# Patient Record
Sex: Female | Born: 1955 | Race: Asian | Hispanic: No | Marital: Married | Smoking: Never smoker
Health system: Southern US, Community
[De-identification: ages and names within clinical notes are randomized; demographics above are authoritative.]

## PROBLEM LIST (undated history)

## (undated) DIAGNOSIS — T7840XA Allergy, unspecified, initial encounter: Secondary | ICD-10-CM

## (undated) DIAGNOSIS — A159 Respiratory tuberculosis unspecified: Secondary | ICD-10-CM

## (undated) DIAGNOSIS — K8020 Calculus of gallbladder without cholecystitis without obstruction: Secondary | ICD-10-CM

## (undated) DIAGNOSIS — R600 Localized edema: Secondary | ICD-10-CM

## (undated) DIAGNOSIS — R030 Elevated blood-pressure reading, without diagnosis of hypertension: Secondary | ICD-10-CM

## (undated) DIAGNOSIS — E039 Hypothyroidism, unspecified: Secondary | ICD-10-CM

## (undated) DIAGNOSIS — M6740 Ganglion, unspecified site: Secondary | ICD-10-CM

## (undated) DIAGNOSIS — A048 Other specified bacterial intestinal infections: Secondary | ICD-10-CM

## (undated) DIAGNOSIS — E785 Hyperlipidemia, unspecified: Secondary | ICD-10-CM

## (undated) DIAGNOSIS — Z0000 Encounter for general adult medical examination without abnormal findings: Secondary | ICD-10-CM

## (undated) DIAGNOSIS — E079 Disorder of thyroid, unspecified: Secondary | ICD-10-CM

## (undated) DIAGNOSIS — I10 Essential (primary) hypertension: Secondary | ICD-10-CM

## (undated) HISTORY — DX: Encounter for general adult medical examination without abnormal findings: Z00.00

## (undated) HISTORY — DX: Other specified bacterial intestinal infections: A04.8

## (undated) HISTORY — DX: Hyperlipidemia, unspecified: E78.5

## (undated) HISTORY — DX: Localized edema: R60.0

## (undated) HISTORY — DX: Elevated blood-pressure reading, without diagnosis of hypertension: R03.0

## (undated) HISTORY — DX: Disorder of thyroid, unspecified: E07.9

## (undated) HISTORY — DX: Ganglion, unspecified site: M67.40

## (undated) HISTORY — DX: Essential (primary) hypertension: I10

## (undated) HISTORY — DX: Allergy, unspecified, initial encounter: T78.40XA

---

## 2004-02-21 ENCOUNTER — Other Ambulatory Visit: Admission: RE | Admit: 2004-02-21 | Discharge: 2004-02-21 | Payer: Self-pay | Admitting: Family Medicine

## 2004-03-01 ENCOUNTER — Encounter: Admission: RE | Admit: 2004-03-01 | Discharge: 2004-03-01 | Payer: Self-pay | Admitting: Family Medicine

## 2005-11-07 ENCOUNTER — Encounter: Admission: RE | Admit: 2005-11-07 | Discharge: 2005-11-07 | Payer: Self-pay | Admitting: Family Medicine

## 2005-11-13 ENCOUNTER — Other Ambulatory Visit: Admission: RE | Admit: 2005-11-13 | Discharge: 2005-11-13 | Payer: Self-pay | Admitting: Family Medicine

## 2007-10-15 ENCOUNTER — Other Ambulatory Visit: Admission: RE | Admit: 2007-10-15 | Discharge: 2007-10-15 | Payer: Self-pay | Admitting: Family Medicine

## 2009-02-15 ENCOUNTER — Encounter: Admission: RE | Admit: 2009-02-15 | Discharge: 2009-02-15 | Payer: Self-pay | Admitting: Family Medicine

## 2009-03-02 ENCOUNTER — Encounter: Admission: RE | Admit: 2009-03-02 | Discharge: 2009-03-02 | Payer: Self-pay | Admitting: Family Medicine

## 2012-09-21 ENCOUNTER — Encounter: Payer: Self-pay | Admitting: Family Medicine

## 2012-09-21 ENCOUNTER — Ambulatory Visit (INDEPENDENT_AMBULATORY_CARE_PROVIDER_SITE_OTHER): Payer: BC Managed Care – PPO | Admitting: Family Medicine

## 2012-09-21 VITALS — BP 122/80 | HR 64 | Temp 97.9°F | Ht 67.0 in | Wt 154.0 lb

## 2012-09-21 DIAGNOSIS — IMO0001 Reserved for inherently not codable concepts without codable children: Secondary | ICD-10-CM

## 2012-09-21 DIAGNOSIS — E785 Hyperlipidemia, unspecified: Secondary | ICD-10-CM

## 2012-09-21 DIAGNOSIS — R030 Elevated blood-pressure reading, without diagnosis of hypertension: Secondary | ICD-10-CM

## 2012-09-21 DIAGNOSIS — I10 Essential (primary) hypertension: Secondary | ICD-10-CM | POA: Insufficient documentation

## 2012-09-21 DIAGNOSIS — E079 Disorder of thyroid, unspecified: Secondary | ICD-10-CM

## 2012-09-21 DIAGNOSIS — Z0000 Encounter for general adult medical examination without abnormal findings: Secondary | ICD-10-CM

## 2012-09-21 DIAGNOSIS — M6740 Ganglion, unspecified site: Secondary | ICD-10-CM

## 2012-09-21 HISTORY — DX: Reserved for inherently not codable concepts without codable children: IMO0001

## 2012-09-21 HISTORY — DX: Hyperlipidemia, unspecified: E78.5

## 2012-09-21 HISTORY — DX: Essential (primary) hypertension: I10

## 2012-09-21 HISTORY — DX: Encounter for general adult medical examination without abnormal findings: Z00.00

## 2012-09-21 HISTORY — DX: Ganglion, unspecified site: M67.40

## 2012-09-21 MED ORDER — LEVOTHYROXINE SODIUM 88 MCG PO TABS: 88.0000 ug | ORAL_TABLET | Freq: Every day | ORAL | Status: DC

## 2012-09-21 NOTE — Assessment & Plan Note (Signed)
Agrees to return for fasting labs next week and then return for gyn exam next month. Will request old records.

## 2012-09-21 NOTE — Assessment & Plan Note (Signed)
Patient reports mild HA and dysequilibrium when her bp is higher, she notes some numbers as hi as 140/95. Minimize sodium and caffeine, get 8  Hours of sleep nightly and we will recheck at next visit.

## 2012-09-21 NOTE — Assessment & Plan Note (Signed)
Encouraged vigorous massage with  Aspercreme bid and report if enlarges or becomes uncomfortable

## 2012-09-21 NOTE — Progress Notes (Signed)
Patient ID: Jessica Duke, female   DOB: 1955/11/29, 57 y.o.   MRN: [id number] Jessica Duke [id number] Jan 08, 1956 09/21/2012      Progress Note-Follow Up  Subjective  Chief Complaint  Chief Complaint  Patient presents with  . Establish Care    new patient    HPI  Patient is a 57 yo Asian female in today for new patient appt. She is generally in good health but has some concerns. She notes recently getting roughly 1 HA a month. She describes it as pressure in the top of her head. Denies any pattern as to when it happens and she has no associated symptoms except for a mild sense of disequilibrium. She often checks her bp and is running in the 130 to 140 over 90s range. When she is feeling well her bp is 110s over 70s. No CP/palp/sob/fevers/gi or gu c/o. C/o a nontender nodule on left hand which came up suddenly recently. Had a screening colonoscopy at 50.  Past Medical History  Diagnosis Date  . Thyroid disease   . Preventative health care 09/21/2012  . Ganglion cyst 09/21/2012  . Hyperlipidemia 09/21/2012  . Elevated BP 09/21/2012    Past Surgical History  Procedure Date  . Cesarean section 1986    Family History  Problem Relation Age of Onset  . Hypertension Mother   . Other Mother     kidney problem  . Hypertension Father   . Hyperlipidemia Father   . Hypertension Brother     History   Social History  . Marital Status: Married    Spouse Name: N/A    Number of Children: N/A  . Years of Education: N/A   Occupational History  . Not on file.   Social History Main Topics  . Smoking status: Never Smoker   . Smokeless tobacco: Never Used  . Alcohol Use: Yes     Comment: occasionally  . Drug Use: No  . Sexually Active: Not on file   Other Topics Concern  . Not on file   Social History Narrative  . No narrative on file    Current Outpatient Prescriptions on File Prior to Visit  Medication Sig Dispense Refill  . Calcium Carbonate-Vit D-Min (CALCIUM 1200 PO) Take by  mouth.      . levothyroxine (SYNTHROID, LEVOTHROID) 88 MCG tablet Take 1 tablet (88 mcg total) by mouth daily.  30 tablet  1    No Known Allergies  Review of Systems  Review of Systems  Constitutional: Negative for fever and malaise/fatigue.  HENT: Negative for congestion.   Eyes: Negative for discharge.  Respiratory: Negative for shortness of breath.   Cardiovascular: Negative for chest pain, palpitations and leg swelling.  Gastrointestinal: Negative for nausea, abdominal pain and diarrhea.  Genitourinary: Negative for dysuria.  Musculoskeletal: Negative for falls.  Skin: Negative for rash.  Neurological: Negative for loss of consciousness and headaches.  Endo/Heme/Allergies: Negative for polydipsia.  Psychiatric/Behavioral: Negative for depression and suicidal ideas. The patient is not nervous/anxious and does not have insomnia.      Objective  BP 122/80  Pulse 64  Temp 97.9 F (36.6 C) (Oral)  Ht 5\' 7"  (1.702 m)  Wt 154 lb (69.854 kg)  BMI 24.12 kg/m2  SpO2 98%  Physical Exam  Physical Exam  Constitutional: She is oriented to person, place, and time and well-developed, well-nourished, and in no distress. No distress.  HENT:  Head: Normocephalic and atraumatic.  Right Ear: External ear normal.  Left Ear: External ear  normal.  Nose: Nose normal.  Mouth/Throat: Oropharynx is clear and moist. No oropharyngeal exudate.  Eyes: Conjunctivae normal are normal. Pupils are equal, round, and reactive to light. Right eye exhibits no discharge. Left eye exhibits no discharge. No scleral icterus.  Neck: Normal range of motion. Neck supple. No thyromegaly present.  Cardiovascular: Normal rate, regular rhythm, normal heart sounds and intact distal pulses.   No murmur heard. Pulmonary/Chest: Effort normal and breath sounds normal. No respiratory distress. She has no wheezes. She has no rales.  Abdominal: Soft. Bowel sounds are normal. She exhibits no distension and no mass. There  is no tenderness.  Musculoskeletal: Normal range of motion. She exhibits no edema and no tenderness.  Lymphadenopathy:    She has no cervical adenopathy.  Neurological: She is alert and oriented to person, place, and time. She has normal reflexes. No cranial nerve deficit. Coordination normal.  Skin: Skin is warm and dry. No rash noted. She is not diaphoretic.  Psychiatric: Mood, memory and affect normal.     Assessment & Plan  Preventative health care Agrees to return for fasting labs next week and then return for gyn exam next month. Will request old records.  Thyroid disease Given refill on Levothyroxine and check a TSH next week.  Ganglion cyst Encouraged vigorous massage with  Aspercreme bid and report if enlarges or becomes uncomfortable  Hyperlipidemia Avoid trans fats, add Krill oil, check lipid profile  Elevated BP Patient reports mild HA and dysequilibrium when her bp is higher, she notes some numbers as hi as 140/95. Minimize sodium and caffeine, get 8  Hours of sleep nightly and we will recheck at next visit.

## 2012-09-21 NOTE — Assessment & Plan Note (Signed)
Given refill on Levothyroxine and check a TSH next week.

## 2012-09-21 NOTE — Assessment & Plan Note (Signed)
Avoid trans fats, add Krill oil, check lipid profile

## 2012-09-21 NOTE — Patient Instructions (Addendum)
Start a probiotic such as Digestive Advantage by Sch[employer] and MegaRed krill oil caps by [employer]  Rel of rec Dr Sondra Come family  Aspercreme to cyst on hand twice a day Decrease coffee to 1 a day  Preventive Care for Adults, Female A healthy lifestyle and preventive care can promote health and wellness. Preventive health guidelines for women include the following key practices.  A routine yearly physical is a good way to check with your caregiver about your health and preventive screening. It is a chance to share any concerns and updates on your health, and to receive a thorough exam.  Visit your dentist for a routine exam and preventive care every 6 months. Brush your teeth twice a day and floss once a day. Good oral hygiene prevents tooth decay and gum disease.  The frequency of eye exams is based on your age, health, family medical history, use of contact lenses, and other factors. Follow your caregiver's recommendations for frequency of eye exams.  Eat a healthy diet. Foods like vegetables, fruits, whole grains, low-fat dairy products, and lean protein foods contain the nutrients you need without too many calories. Decrease your intake of foods high in solid fats, added sugars, and salt. Eat the right amount of calories for you.Get information about a proper diet from your caregiver, if necessary.  Regular physical exercise is one of the most important things you can do for your health. Most adults should get at least 150 minutes of moderate-intensity exercise (any activity that increases your heart rate and causes you to sweat) each week. In addition, most adults need muscle-strengthening exercises on 2 or more days a week.  Maintain a healthy weight. The body mass index (BMI) is a screening tool to identify possible weight problems. It provides an estimate of body fat based on height and weight. Your caregiver can help determine your BMI, and can help you achieve or maintain a healthy  weight.For adults 20 years and older:  A BMI below 18.5 is considered underweight.  A BMI of 18.5 to 24.9 is normal.  A BMI of 25 to 29.9 is considered overweight.  A BMI of 30 and above is considered obese.  Maintain normal blood lipids and cholesterol levels by exercising and minimizing your intake of saturated fat. Eat a balanced diet with plenty of fruit and vegetables. Blood tests for lipids and cholesterol should begin at age 28 and be repeated every 5 years. If your lipid or cholesterol levels are high, you are over 50, or you are at high risk for heart disease, you may need your cholesterol levels checked more frequently.Ongoing high lipid and cholesterol levels should be treated with medicines if diet and exercise are not effective.  If you smoke, find out from your caregiver how to quit. If you do not use tobacco, do not start.  If you are pregnant, do not drink alcohol. If you are breastfeeding, be very cautious about drinking alcohol. If you are not pregnant and choose to drink alcohol, do not exceed 1 drink per day. One drink is considered to be 12 ounces (355 mL) of beer, 5 ounces (148 mL) of wine, or 1.5 ounces (44 mL) of liquor.  Avoid use of street drugs. Do not share needles with anyone. Ask for help if you need support or instructions about stopping the use of drugs.  High blood pressure causes heart disease and increases the risk of stroke. Your blood pressure should be checked at least every 1 to 2  years. Ongoing high blood pressure should be treated with medicines if weight loss and exercise are not effective.  If you are 8 to 57 years old, ask your caregiver if you should take aspirin to prevent strokes.  Diabetes screening involves taking a blood sample to check your fasting blood sugar level. This should be done once every 3 years, after age 34, if you are within normal weight and without risk factors for diabetes. Testing should be considered at a younger age or be  carried out more frequently if you are overweight and have at least 1 risk factor for diabetes.  Breast cancer screening is essential preventive care for women. You should practice "breast self-awareness." This means understanding the normal appearance and feel of your breasts and may include breast self-examination. Any changes detected, no matter how small, should be reported to a caregiver. Women in their 60s and 30s should have a clinical breast exam (CBE) by a caregiver as part of a regular health exam every 1 to 3 years. After age 69, women should have a CBE every year. Starting at age 53, women should consider having a mammography (breast X-ray test) every year. Women who have a family history of breast cancer should talk to their caregiver about genetic screening. Women at a high risk of breast cancer should talk to their caregivers about having magnetic resonance imaging (MRI) and a mammography every year.  The Pap test is a screening test for cervical cancer. A Pap test can show cell changes on the cervix that might become cervical cancer if left untreated. A Pap test is a procedure in which cells are obtained and examined from the lower end of the uterus (cervix).  Women should have a Pap test starting at age 65.  Between ages 58 and 57, Pap tests should be repeated every 2 years.  Beginning at age 11, you should have a Pap test every 3 years as long as the past 3 Pap tests have been normal.  Some women have medical problems that increase the chance of getting cervical cancer. Talk to your caregiver about these problems. It is especially important to talk to your caregiver if a new problem develops soon after your last Pap test. In these cases, your caregiver may recommend more frequent screening and Pap tests.  The above recommendations are the same for women who have or have not gotten the vaccine for human papillomavirus (HPV).  If you had a hysterectomy for a problem that was not  cancer or a condition that could lead to cancer, then you no longer need Pap tests. Even if you no longer need a Pap test, a regular exam is a good idea to make sure no other problems are starting.  If you are between ages 73 and 72, and you have had normal Pap tests going back 10 years, you no longer need Pap tests. Even if you no longer need a Pap test, a regular exam is a good idea to make sure no other problems are starting.  If you have had past treatment for cervical cancer or a condition that could lead to cancer, you need Pap tests and screening for cancer for at least 20 years after your treatment.  If Pap tests have been discontinued, risk factors (such as a new sexual partner) need to be reassessed to determine if screening should be resumed.  The HPV test is an additional test that may be used for cervical cancer screening. The HPV test looks  for the virus that can cause the cell changes on the cervix. The cells collected during the Pap test can be tested for HPV. The HPV test could be used to screen women aged 46 years and older, and should be used in women of any age who have unclear Pap test results. After the age of 29, women should have HPV testing at the same frequency as a Pap test.  Colorectal cancer can be detected and often prevented. Most routine colorectal cancer screening begins at the age of 43 and continues through age 13. However, your caregiver may recommend screening at an earlier age if you have risk factors for colon cancer. On a yearly basis, your caregiver may provide home test kits to check for hidden blood in the stool. Use of a small camera at the end of a tube, to directly examine the colon (sigmoidoscopy or colonoscopy), can detect the earliest forms of colorectal cancer. Talk to your caregiver about this at age 34, when routine screening begins. Direct examination of the colon should be repeated every 5 to 10 years through age 43, unless early forms of pre-cancerous  polyps or small growths are found.  Hepatitis C blood testing is recommended for all people born from [birth year] through 1965 and any individual with known risks for hepatitis C.  Practice safe sex. Use condoms and avoid high-risk sexual practices to reduce the spread of sexually transmitted infections (STIs). STIs include gonorrhea, chlamydia, syphilis, trichomonas, herpes, HPV, and human immunodeficiency virus (HIV). Herpes, HIV, and HPV are viral illnesses that have no cure. They can result in disability, cancer, and death. Sexually active women aged 32 and younger should be checked for chlamydia. Older women with new or multiple partners should also be tested for chlamydia. Testing for other STIs is recommended if you are sexually active and at increased risk.  Osteoporosis is a disease in which the bones lose minerals and strength with aging. This can result in serious bone fractures. The risk of osteoporosis can be identified using a bone density scan. Women ages 44 and over and women at risk for fractures or osteoporosis should discuss screening with their caregivers. Ask your caregiver whether you should take a calcium supplement or vitamin D to reduce the rate of osteoporosis.  Menopause can be associated with physical symptoms and risks. Hormone replacement therapy is available to decrease symptoms and risks. You should talk to your caregiver about whether hormone replacement therapy is right for you.  Use sunscreen with sun protection factor (SPF) of 30 or more. Apply sunscreen liberally and repeatedly throughout the day. You should seek shade when your shadow is shorter than you. Protect yourself by wearing long sleeves, pants, a wide-brimmed hat, and sunglasses year round, whenever you are outdoors.  Once a month, do a whole body skin exam, using a mirror to look at the skin on your back. Notify your caregiver of new moles, moles that have irregular borders, moles that are larger than a pencil  eraser, or moles that have changed in shape or color.  Stay current with required immunizations.  Influenza. You need a dose every fall (or winter). The composition of the flu vaccine changes each year, so being vaccinated once is not enough.  Pneumococcal polysaccharide. You need 1 to 2 doses if you smoke cigarettes or if you have certain chronic medical conditions. You need 1 dose at age 11 (or older) if you have never been vaccinated.  Tetanus, diphtheria, pertussis (Tdap, Td). Get 1 dose  of Tdap vaccine if you are younger than age 67, are over 16 and have contact with an infant, are a Dietitian, are pregnant, or simply want to be protected from whooping cough. After that, you need a Td booster dose every 10 years. Consult your caregiver if you have not had at least 3 tetanus and diphtheria-containing shots sometime in your life or have a deep or dirty wound.  HPV. You need this vaccine if you are a woman age 53 or younger. The vaccine is given in 3 doses over 6 months.  Measles, mumps, rubella (MMR). You need at least 1 dose of MMR if you were born in 1957 or later. You may also need a second dose.  Meningococcal. If you are age 28 to 57 and a first-year college student living in a residence hall, or have one of several medical conditions, you need to get vaccinated against meningococcal disease. You may also need additional booster doses.  Zoster (shingles). If you are age 45 or older, you should get this vaccine.  Varicella (chickenpox). If you have never had chickenpox or you were vaccinated but received only 1 dose, talk to your caregiver to find out if you need this vaccine.  Hepatitis A. You need this vaccine if you have a specific risk factor for hepatitis A virus infection or you simply wish to be protected from this disease. The vaccine is usually given as 2 doses, 6 to 18 months apart.  Hepatitis B. You need this vaccine if you have a specific risk factor for hepatitis B  virus infection or you simply wish to be protected from this disease. The vaccine is given in 3 doses, usually over 6 months. Preventive Services / Frequency Ages 52 to 14  Blood pressure check.** / Every 1 to 2 years.  Lipid and cholesterol check.** / Every 5 years beginning at age 67.  Clinical breast exam.** / Every 3 years for women in their 53s and 46s.  Pap test.** / Every 2 years from ages 12 through 95. Every 3 years starting at age 73 through age 42 or 38 with a history of 3 consecutive normal Pap tests.  HPV screening.** / Every 3 years from ages 11 through ages 82 to 72 with a history of 3 consecutive normal Pap tests.  Hepatitis C blood test.** / For any individual with known risks for hepatitis C.  Skin self-exam. / Monthly.  Influenza immunization.** / Every year.  Pneumococcal polysaccharide immunization.** / 1 to 2 doses if you smoke cigarettes or if you have certain chronic medical conditions.  Tetanus, diphtheria, pertussis (Tdap, Td) immunization. / A one-time dose of Tdap vaccine. After that, you need a Td booster dose every 10 years.  HPV immunization. / 3 doses over 6 months, if you are 79 and younger.  Measles, mumps, rubella (MMR) immunization. / You need at least 1 dose of MMR if you were born in 1957 or later. You may also need a second dose.  Meningococcal immunization. / 1 dose if you are age 30 to 62 and a first-year college student living in a residence hall, or have one of several medical conditions, you need to get vaccinated against meningococcal disease. You may also need additional booster doses.  Varicella immunization.** / Consult your caregiver.  Hepatitis A immunization.** / Consult your caregiver. 2 doses, 6 to 18 months apart.  Hepatitis B immunization.** / Consult your caregiver. 3 doses usually over 6 months. Ages 66 to 71  Blood  pressure check.** / Every 1 to 2 years.  Lipid and cholesterol check.** / Every 5 years beginning at age  89.  Clinical breast exam.** / Every year after age 76.  Mammogram.** / Every year beginning at age 32 and continuing for as long as you are in good health. Consult with your caregiver.  Pap test.** / Every 3 years starting at age 40 through age 66 or 35 with a history of 3 consecutive normal Pap tests.  HPV screening.** / Every 3 years from ages 88 through ages 66 to 33 with a history of 3 consecutive normal Pap tests.  Fecal occult blood test (FOBT) of stool. / Every year beginning at age 38 and continuing until age 23. You may not need to do this test if you get a colonoscopy every 10 years.  Flexible sigmoidoscopy or colonoscopy.** / Every 5 years for a flexible sigmoidoscopy or every 10 years for a colonoscopy beginning at age 59 and continuing until age 2.  Hepatitis C blood test.** / For all people born from [birth year] through 1965 and any individual with known risks for hepatitis C.  Skin self-exam. / Monthly.  Influenza immunization.** / Every year.  Pneumococcal polysaccharide immunization.** / 1 to 2 doses if you smoke cigarettes or if you have certain chronic medical conditions.  Tetanus, diphtheria, pertussis (Tdap, Td) immunization.** / A one-time dose of Tdap vaccine. After that, you need a Td booster dose every 10 years.  Measles, mumps, rubella (MMR) immunization. / You need at least 1 dose of MMR if you were born in 1957 or later. You may also need a second dose.  Varicella immunization.** / Consult your caregiver.  Meningococcal immunization.** / Consult your caregiver.  Hepatitis A immunization.** / Consult your caregiver. 2 doses, 6 to 18 months apart.  Hepatitis B immunization.** / Consult your caregiver. 3 doses, usually over 6 months. Ages 52 and over  Blood pressure check.** / Every 1 to 2 years.  Lipid and cholesterol check.** / Every 5 years beginning at age 63.  Clinical breast exam.** / Every year after age 22.  Mammogram.** / Every year beginning at  age 3 and continuing for as long as you are in good health. Consult with your caregiver.  Pap test.** / Every 3 years starting at age 108 through age 48 or 80 with a 3 consecutive normal Pap tests. Testing can be stopped between 65 and 70 with 3 consecutive normal Pap tests and no abnormal Pap or HPV tests in the past 10 years.  HPV screening.** / Every 3 years from ages 38 through ages 22 or 44 with a history of 3 consecutive normal Pap tests. Testing can be stopped between 65 and 70 with 3 consecutive normal Pap tests and no abnormal Pap or HPV tests in the past 10 years.  Fecal occult blood test (FOBT) of stool. / Every year beginning at age 33 and continuing until age 46. You may not need to do this test if you get a colonoscopy every 10 years.  Flexible sigmoidoscopy or colonoscopy.** / Every 5 years for a flexible sigmoidoscopy or every 10 years for a colonoscopy beginning at age 41 and continuing until age 84.  Hepatitis C blood test.** / For all people born from [birth year] through 1965 and any individual with known risks for hepatitis C.  Osteoporosis screening.** / A one-time screening for women ages 82 and over and women at risk for fractures or osteoporosis.  Skin self-exam. / Monthly.  Influenza  immunization.** / Every year.  Pneumococcal polysaccharide immunization.** / 1 dose at age 63 (or older) if you have never been vaccinated.  Tetanus, diphtheria, pertussis (Tdap, Td) immunization. / A one-time dose of Tdap vaccine if you are over 65 and have contact with an infant, are a Research scientist (physical sciences), or simply want to be protected from whooping cough. After that, you need a Td booster dose every 10 years.  Varicella immunization.** / Consult your caregiver.  Meningococcal immunization.** / Consult your caregiver.  Hepatitis A immunization.** / Consult your caregiver. 2 doses, 6 to 18 months apart.  Hepatitis B immunization.** / Check with your caregiver. 3 doses, usually over 6  months. ** Family history and personal history of risk and conditions may change your caregiver's recommendations. Document Released: 10/07/2001 Document Revised: 11/03/2011 Document Reviewed: 01/06/2011 [facility] Patient Information 2013 [place], [place].

## 2012-09-27 ENCOUNTER — Telehealth: Payer: Self-pay

## 2012-09-27 ENCOUNTER — Other Ambulatory Visit: Payer: Self-pay | Admitting: Family Medicine

## 2012-09-27 NOTE — Telephone Encounter (Signed)
Opened in error

## 2012-09-28 LAB — BASIC METABOLIC PANEL
CO2: 27 mEq/L (ref 19–32)
Calcium: 9.4 mg/dL (ref 8.4–10.5)
Chloride: 105 mEq/L (ref 96–112)
Creat: 0.77 mg/dL (ref 0.50–1.10)
Sodium: 141 mEq/L (ref 135–145)

## 2012-09-28 LAB — CBC
HCT: 37.9 % (ref 36.0–46.0)
MCH: 30.5 pg (ref 26.0–34.0)
MCV: 89.6 fL (ref 78.0–100.0)
Platelets: 177 10*3/uL (ref 150–400)

## 2012-09-28 LAB — HEPATIC FUNCTION PANEL
ALT: 11 U/L (ref 0–35)
Alkaline Phosphatase: 42 U/L (ref 39–117)
Total Protein: 6.4 g/dL (ref 6.0–8.3)

## 2012-09-28 LAB — TSH: TSH: 0.887 u[IU]/mL (ref 0.350–4.500)

## 2012-09-28 LAB — LIPID PANEL
HDL: 62 mg/dL (ref >39)
Total CHOL/HDL Ratio: 3.6 Ratio
Triglycerides: 98 mg/dL (ref <150)
VLDL: 20 mg/dL (ref 0–40)

## 2012-09-29 MED ORDER — AMOXICILLIN 500 MG PO CAPS: 1000.0000 mg | ORAL_CAPSULE | Freq: Two times a day (BID) | ORAL | Status: DC

## 2012-09-29 MED ORDER — CLARITHROMYCIN 500 MG PO TABS: 500.0000 mg | ORAL_TABLET | Freq: Two times a day (BID) | ORAL | Status: DC

## 2012-09-29 MED ORDER — OMEPRAZOLE 20 MG PO CPDR: 20.0000 mg | DELAYED_RELEASE_CAPSULE | Freq: Two times a day (BID) | ORAL | Status: DC

## 2012-09-29 NOTE — Addendum Note (Signed)
Addended by: Court Joy on: 09/29/2012 12:29 PM   Modules accepted: Orders

## 2012-10-18 ENCOUNTER — Ambulatory Visit (INDEPENDENT_AMBULATORY_CARE_PROVIDER_SITE_OTHER): Payer: BC Managed Care – PPO | Admitting: Family Medicine

## 2012-10-18 ENCOUNTER — Encounter: Payer: Self-pay | Admitting: Family Medicine

## 2012-10-18 VITALS — BP 118/78 | HR 63 | Temp 97.7°F | Ht 67.0 in | Wt 156.0 lb

## 2012-10-18 DIAGNOSIS — R1013 Epigastric pain: Secondary | ICD-10-CM

## 2012-10-18 DIAGNOSIS — A048 Other specified bacterial intestinal infections: Secondary | ICD-10-CM

## 2012-10-18 DIAGNOSIS — R600 Localized edema: Secondary | ICD-10-CM

## 2012-10-18 DIAGNOSIS — R609 Edema, unspecified: Secondary | ICD-10-CM

## 2012-10-18 DIAGNOSIS — E785 Hyperlipidemia, unspecified: Secondary | ICD-10-CM

## 2012-10-18 DIAGNOSIS — T7840XA Allergy, unspecified, initial encounter: Secondary | ICD-10-CM

## 2012-10-18 DIAGNOSIS — K3189 Other diseases of stomach and duodenum: Secondary | ICD-10-CM

## 2012-10-18 DIAGNOSIS — R030 Elevated blood-pressure reading, without diagnosis of hypertension: Secondary | ICD-10-CM

## 2012-10-18 HISTORY — DX: Allergy, unspecified, initial encounter: T78.40XA

## 2012-10-18 HISTORY — DX: Other specified bacterial intestinal infections: A04.8

## 2012-10-18 HISTORY — DX: Localized edema: R60.0

## 2012-10-18 MED ORDER — OMEPRAZOLE 20 MG PO CPDR: 20.0000 mg | DELAYED_RELEASE_CAPSULE | Freq: Every day | ORAL | Status: DC

## 2012-10-18 MED ORDER — PROBIOTIC PRODUCT PO CAPS: ORAL_CAPSULE | ORAL | Status: DC

## 2012-10-18 MED ORDER — CETIRIZINE HCL 10 MG PO TABS: 10.0000 mg | ORAL_TABLET | Freq: Every day | ORAL | Status: DC | PRN

## 2012-10-18 MED ORDER — FLUTICASONE PROPIONATE 50 MCG/ACT NA SUSP: 2.0000 | Freq: Every day | NASAL | Status: DC

## 2012-10-18 NOTE — Assessment & Plan Note (Signed)
Mild, start Krill oil, avoid trans fats

## 2012-10-18 NOTE — Assessment & Plan Note (Signed)
Swollen eyelids each am, consider allergic, given Cetirizine 10 mg daily and Flonase daily

## 2012-10-18 NOTE — Patient Instructions (Addendum)
Helicobacter Pylori and Ulcer Disease  An ulcer may be in your stomach (gastric ulcer) or in the first part of your small bowel, which is called the duodenum (duodenal ulcer). An ulcer is a break in the stomach or duodenum lining. The break wears down into the deeper tissue. Helicobacter pylori (H. pylori) is a type of germ (bacteria) that may cause the majority of gastric or duodenal ulcers.  CAUSES   · A germ (bacterium). H. pylori can weaken the protective mucous coating of the stomach and duodenum. This allows acid to get through to the sensitive lining of the stomach or duodenum and an ulcer can then form.  · Certain medications.  · Using substances that can bother the lining of the stomach (alcohol, tobacco or medications such as Advil or Motrin) in the presence of H.pylori infection. This can increase the chances of getting an ulcer.  · Cancer (rarely).  Most people infected with H. pylori do not get ulcers. It is not known how people catch H. pylori. It may be through food or water. H. pylori has been found in the saliva of some infected people. Therefore, the bacteria may also spread through mouth-to-mouth contact such as kissing.  SYMPTOMS   The problems (symptoms) of ulcer disease are usually:  · A burning or gnawing of the mid-upper belly (abdomen). This is often worse on an empty stomach. It may get better with food. This may be associated with feeling sick to your stomach (nausea), bloating and vomiting.  · If the ulcer results in bleeding, it can cause:  · Black, tarry stools.  · Throwing up bright red blood.  · Throwing up coffee ground looking materials.  With severe bleeding, there may be loss of consciousness and shock.  Besides ulcer disease, H. pylori can also cause chronic gastritis (irritation of the lining of the stomach without ulcer) or stomach acid-type discomfort. You may not have symptoms even though you have an H. pylori infection. Although this is an infection, you may not have usual  infection symptoms (such as fever).  DIAGNOSIS   Ulcer disease can be diagnosed in many different ways. If you have an ulcer, it is important to know whether or not it is caused by H. Pylori. Treatment for an ulcer caused by H. pylori is different from that for an ulcer with other causes. The best way to detect H. pylori is taking tissue directly from the ulcer during an endoscopy test.   · An endoscopy is an exam that uses an endoscope. This is a thin, lighted tube with a small camera on the end. It is like a flexible telescope. The patient is given a drug to make them calm (sedative). The caregiver eases the endoscope into the mouth and down the throat to the stomach and duodenum. This allows the doctor to see the lining of the esophagus, stomach and duodenum.  · If an endoscopy is not needed, then H. pylori can be detected with tests of the blood, stool or even breath.  TREATMENT   · H. pylori peptic ulcer treatment usually involves a combination of:  · Medicines that kill germs (antibiotics).  · Acid suppressors.  · Stomach protectors.  · The use of only one medication to treat H. pylori is not recommended. The most proven treatment is a 2 week course of treatment called triple therapy. It involves taking two antibiotics to kill the bacteria and either an acid suppressor or stomach-lining shield. Two-week triple therapy reduces ulcer symptoms,   kills the bacteria, and prevents ulcers from coming back in many patients.  · Unfortunately, patients may find triple therapy hard to do. This is because it involves taking as many as 20 pills a day. Also, the antibiotics used in triple therapy may cause mild side effects. These include nausea, vomiting, diarrhea, dark stools, a metallic taste in the mouth, dizziness, headache and yeast infections in women. Talk to your caregiver if you have any of these side effects.  HOME CARE INSTRUCTIONS   · Take your medications as directed and for as long as prescribed. Contact your  caregiver if you have problems or side effects from your medications.  · Continue regular work and usual activities unless told otherwise by your caregiver.  · Avoid tobacco, alcohol and caffeine. Tobacco use will decrease and slow healing.  · Avoid medications that are harmful. This includes aspirin and NSAIDS such as ibuprofen and naproxen.  · Avoid foods that seem to aggravate or cause discomfort.  · There are many over-the-counter products available to control stomach acid and other symptoms. Discuss these with your caregiver before using them. Do not  stop taking prescription medications for over-the-counter medications without talking with your caregiver.  · Special diets are not usually needed.  · Keep any follow-up appointments and blood tests as directed.  SEEK MEDICAL CARE IF:   · Your pain or other ulcer symptoms do not improve within a few days of starting treatment.  · You develop diarrhea. This can be a problem related to certain treatments.  · You have ongoing indigestion or heartburn even if your main ulcer symptoms are improved.  · You think you have any side effects from your medications or if you do not understand how to use your medications right.  SEEK IMMEDIATE MEDICAL CARE IF:   Any of the following happen:  · You develop bright red, rectal bleeding.  · You develop dark black, tarry stools.  · You throw up (vomit) blood.  · You become light-headed, weak, have fainting episodes, or become sweaty, cold and clammy.  · You have severe abdominal pain not controlled by medications. Do not take pain medications unless ordered by your caregiver.  MAKE SURE YOU:   · Understand these instructions.  · Will watch your condition.  · Will get help right away if you are not doing well or get worse.  Document Released: 11/01/2003 Document Revised: 11/03/2011 Document Reviewed: 03/30/2008  [employer]® Patient Information ©2013 [employer].

## 2012-10-18 NOTE — Assessment & Plan Note (Signed)
Mild elevation, will continue to monitor

## 2012-10-18 NOTE — Assessment & Plan Note (Signed)
Trace, encouraged compression hose, minimize sodium, report worsening symptoms

## 2012-10-18 NOTE — Assessment & Plan Note (Signed)
Finished acute treatment, symptoms improving but still belching and having dyspepsia. Start probiotics and Omeprazole

## 2012-10-18 NOTE — Progress Notes (Signed)
Patient ID: Jessica Duke, female   DOB: 1955-08-28, 57 y.o.   MRN: [id number] Jessica Duke [id number] 1956/02/01 10/18/2012      Progress Note-Follow Up  Subjective  Chief Complaint  Chief Complaint  Patient presents with  . Follow-up    4 week     HPI  Patient is a 57 year old Asian female who is in today for followup. She's recently finished her course of treatment for H. pylori infection as she is feeling better. She still has some mild dyspepsia bloating but her pain is greatly improved. No nausea vomiting. No anorexia or fevers. No GU complaints. Overall she feels improved. She's complaining of some edema in her ankles as well as some edema in her eyes at times most notably in the morning. No headache, ear pain, chest pain, palpitations, shortness of breath.  Past Medical History  Diagnosis Date  . Thyroid disease   . Preventative health care 09/21/2012  . Ganglion cyst 09/21/2012  . Hyperlipidemia 09/21/2012  . Elevated BP 09/21/2012  . H. pylori infection 10/18/2012  . Allergic state 10/18/2012  . Pedal edema 10/18/2012    Past Surgical History  Procedure Laterality Date  . Cesarean section  1986    Family History  Problem Relation Age of Onset  . Hypertension Mother   . Other Mother     kidney problem  . Hypertension Father   . Hyperlipidemia Father   . Hypertension Brother     History   Social History  . Marital Status: Married    Spouse Name: N/A    Number of Children: N/A  . Years of Education: N/A   Occupational History  . Not on file.   Social History Main Topics  . Smoking status: Never Smoker   . Smokeless tobacco: Never Used  . Alcohol Use: Yes     Comment: occasionally  . Drug Use: No  . Sexually Active: Not on file   Other Topics Concern  . Not on file   Social History Narrative  . No narrative on file    Current Outpatient Prescriptions on File Prior to Visit  Medication Sig Dispense Refill  . Calcium Carbonate-Vit D-Min (CALCIUM 1200 PO)  Take by mouth.      . fish oil-omega-3 fatty acids 1000 MG capsule Take 2 g by mouth daily.      Marland Kitchen levothyroxine (SYNTHROID, LEVOTHROID) 88 MCG tablet Take 1 tablet (88 mcg total) by mouth daily.  30 tablet  1  . Multiple Vitamin (MULTIVITAMIN) tablet Take 1 tablet by mouth daily.       No current facility-administered medications on file prior to visit.    No Known Allergies  Review of Systems  Review of Systems  Constitutional: Negative for fever and malaise/fatigue.  HENT: Negative for congestion.   Eyes: Negative for discharge.  Respiratory: Negative for shortness of breath.   Cardiovascular: Negative for chest pain, palpitations and leg swelling.  Gastrointestinal: Positive for heartburn. Negative for nausea, vomiting, abdominal pain and diarrhea.  Genitourinary: Negative for dysuria.  Musculoskeletal: Negative for falls.  Skin: Negative for rash.  Neurological: Negative for loss of consciousness and headaches.  Endo/Heme/Allergies: Negative for polydipsia.  Psychiatric/Behavioral: Negative for depression and suicidal ideas. The patient is not nervous/anxious and does not have insomnia.     Objective  BP 118/78  Pulse 63  Temp(Src) 97.7 F (36.5 C) (Oral)  Ht 5\' 7"  (1.702 m)  Wt 156 lb (70.761 kg)  BMI 24.43 kg/m2  SpO2 98%  Physical  Exam  Physical Exam  Constitutional: She is oriented to person, place, and time and well-developed, well-nourished, and in no distress. No distress.  HENT:  Head: Normocephalic and atraumatic.  Eyes: Conjunctivae are normal.  Neck: Neck supple. No thyromegaly present.  Cardiovascular: Normal rate, regular rhythm and normal heart sounds.   No murmur heard. Pulmonary/Chest: Effort normal and breath sounds normal. She has no wheezes.  Abdominal: Soft. Bowel sounds are normal. She exhibits no distension and no mass. There is no tenderness. There is no rebound and no guarding.  Musculoskeletal: She exhibits no edema.  Lymphadenopathy:     She has no cervical adenopathy.  Neurological: She is alert and oriented to person, place, and time.  Skin: Skin is warm and dry. No rash noted. She is not diaphoretic.  Psychiatric: Memory, affect and judgment normal.    Lab Results  Component Value Date   TSH 0.887 09/27/2012   Lab Results  Component Value Date   WBC 4.8 09/27/2012   HGB 12.9 09/27/2012   HCT 37.9 09/27/2012   MCV 89.6 09/27/2012   PLT 177 09/27/2012   Lab Results  Component Value Date   CREATININE 0.77 09/27/2012   BUN 13 09/27/2012   NA 141 09/27/2012   K 4.2 09/27/2012   CL 105 09/27/2012   CO2 27 09/27/2012   Lab Results  Component Value Date   ALT 11 09/27/2012   AST 14 09/27/2012   ALKPHOS 42 09/27/2012   BILITOT 0.7 09/27/2012   Lab Results  Component Value Date   CHOL 226* 09/27/2012   Lab Results  Component Value Date   HDL 62 09/27/2012   Lab Results  Component Value Date   LDLCALC 144* 09/27/2012   Lab Results  Component Value Date   TRIG 98 09/27/2012   Lab Results  Component Value Date   CHOLHDL 3.6 09/27/2012     Assessment & Plan  H. pylori infection Finished acute treatment, symptoms improving but still belching and having dyspepsia. Start probiotics and Omeprazole  Elevated BP Mild elevation, will continue to monitor  Hyperlipidemia Mild, start Krill oil, avoid trans fats  Pedal edema Trace, encouraged compression hose, minimize sodium, report worsening symptoms  Allergic state Swollen eyelids each am, consider allergic, given Cetirizine 10 mg daily and Flonase daily

## 2012-10-20 ENCOUNTER — Encounter: Payer: Self-pay | Admitting: Family Medicine

## 2013-01-20 ENCOUNTER — Ambulatory Visit (INDEPENDENT_AMBULATORY_CARE_PROVIDER_SITE_OTHER): Payer: BC Managed Care – PPO | Admitting: Family Medicine

## 2013-01-20 ENCOUNTER — Encounter: Payer: Self-pay | Admitting: Family Medicine

## 2013-01-20 VITALS — BP 132/88 | HR 57 | Temp 97.5°F | Ht 67.0 in | Wt 150.1 lb

## 2013-01-20 DIAGNOSIS — K3189 Other diseases of stomach and duodenum: Secondary | ICD-10-CM

## 2013-01-20 DIAGNOSIS — E785 Hyperlipidemia, unspecified: Secondary | ICD-10-CM

## 2013-01-20 DIAGNOSIS — R1013 Epigastric pain: Secondary | ICD-10-CM

## 2013-01-20 DIAGNOSIS — R030 Elevated blood-pressure reading, without diagnosis of hypertension: Secondary | ICD-10-CM

## 2013-01-20 DIAGNOSIS — T7840XA Allergy, unspecified, initial encounter: Secondary | ICD-10-CM

## 2013-01-20 DIAGNOSIS — R739 Hyperglycemia, unspecified: Secondary | ICD-10-CM

## 2013-01-20 DIAGNOSIS — R5381 Other malaise: Secondary | ICD-10-CM

## 2013-01-20 DIAGNOSIS — E039 Hypothyroidism, unspecified: Secondary | ICD-10-CM

## 2013-01-20 DIAGNOSIS — I10 Essential (primary) hypertension: Secondary | ICD-10-CM

## 2013-01-20 DIAGNOSIS — R7309 Other abnormal glucose: Secondary | ICD-10-CM

## 2013-01-20 LAB — CBC
MCHC: 34 g/dL (ref 30.0–36.0)
RDW: 13.2 % (ref 11.5–15.5)
WBC: 5.7 10*3/uL (ref 4.0–10.5)

## 2013-01-20 LAB — RENAL FUNCTION PANEL
Albumin: 4.6 g/dL (ref 3.5–5.2)
BUN: 15 mg/dL (ref 6–23)
CO2: 28 mEq/L (ref 19–32)
Creat: 0.84 mg/dL (ref 0.50–1.10)
Potassium: 4.3 mEq/L (ref 3.5–5.3)

## 2013-01-20 LAB — HEMOGLOBIN A1C: Hgb A1c MFr Bld: 5.7 % — ABNORMAL HIGH (ref <5.7)

## 2013-01-20 MED ORDER — LEVOTHYROXINE SODIUM 88 MCG PO TABS: 88.0000 ug | ORAL_TABLET | Freq: Every day | ORAL | Status: DC

## 2013-01-20 MED ORDER — LISINOPRIL 5 MG PO TABS: 5.0000 mg | ORAL_TABLET | Freq: Every day | ORAL | Status: DC

## 2013-01-20 MED ORDER — FLUTICASONE PROPIONATE 50 MCG/ACT NA SUSP: 2.0000 | Freq: Every day | NASAL | Status: DC

## 2013-01-20 MED ORDER — OMEPRAZOLE 20 MG PO CPDR: 20.0000 mg | DELAYED_RELEASE_CAPSULE | Freq: Every day | ORAL | Status: DC

## 2013-01-20 NOTE — Patient Instructions (Addendum)
Try switching from Zyrtec to Allegra for the eyes  Start a probiotic Digestive Advantage  Labs prior to next visit, fasting lipid, renal, cbc, tsh, hepatic, hgba1c   Allergies, Generic Allergies may happen from anything your body is sensitive to. This may be food, medicines, pollens, chemicals, and nearly anything around you in everyday life that produces allergens. An allergen is anything that causes an allergy producing substance. Heredity is often a factor in causing these problems. This means you may have some of the same allergies as your parents. Food allergies happen in all age groups. Food allergies are some of the most severe and life threatening. Some common food allergies are cow's milk, seafood, eggs, nuts, wheat, and soybeans. SYMPTOMS   Swelling around the mouth.  An itchy red rash or hives.  Vomiting or diarrhea.  Difficulty breathing. SEVERE ALLERGIC REACTIONS ARE LIFE-THREATENING. This reaction is called anaphylaxis. It can cause the mouth and throat to swell and cause difficulty with breathing and swallowing. In severe reactions only a trace amount of food (for example, peanut oil in a salad) may cause death within seconds. Seasonal allergies occur in all age groups. These are seasonal because they usually occur during the same season every year. They may be a reaction to molds, grass pollens, or tree pollens. Other causes of problems are house dust mite allergens, pet dander, and mold spores. The symptoms often consist of nasal congestion, a runny itchy nose associated with sneezing, and tearing itchy eyes. There is often an associated itching of the mouth and ears. The problems happen when you come in contact with pollens and other allergens. Allergens are the particles in the air that the body reacts to with an allergic reaction. This causes you to release allergic antibodies. Through a chain of events, these eventually cause you to release histamine into the blood stream.  Although it is meant to be protective to the body, it is this release that causes your discomfort. This is why you were given anti-histamines to feel better. If you are unable to pinpoint the offending allergen, it may be determined by skin or blood testing. Allergies cannot be cured but can be controlled with medicine. Hay fever is a collection of all or some of the seasonal allergy problems. It may often be treated with simple over-the-counter medicine such as diphenhydramine. Take medicine as directed. Do not drink alcohol or drive while taking this medicine. Check with your caregiver or package insert for child dosages. If these medicines are not effective, there are many new medicines your caregiver can prescribe. Stronger medicine such as nasal spray, eye drops, and corticosteroids may be used if the first things you try do not work well. Other treatments such as immunotherapy or desensitizing injections can be used if all else fails. Follow up with your caregiver if problems continue. These seasonal allergies are usually not life threatening. They are generally more of a nuisance that can often be handled using medicine. HOME CARE INSTRUCTIONS   If unsure what causes a reaction, keep a diary of foods eaten and symptoms that follow. Avoid foods that cause reactions.  If hives or rash are present:  Take medicine as directed.  You may use an over-the-counter antihistamine (diphenhydramine) for hives and itching as needed.  Apply cold compresses (cloths) to the skin or take baths in cool water. Avoid hot baths or showers. Heat will make a rash and itching worse.  If you are severely allergic:  Following a treatment for a severe  reaction, hospitalization is often required for closer follow-up.  Wear a medic-alert bracelet or necklace stating the allergy.  You and your family must learn how to give adrenaline or use an anaphylaxis kit.  If you have had a severe reaction, always carry your  anaphylaxis kit or EpiPen with you. Use this medicine as directed by your caregiver if a severe reaction is occurring. Failure to do so could have a fatal outcome. SEEK MEDICAL CARE IF:  You suspect a food allergy. Symptoms generally happen within 30 minutes of eating a food.  Your symptoms have not gone away within 2 days or are getting worse.  You develop new symptoms.  You want to retest yourself or your child with a food or drink you think causes an allergic reaction. Never do this if an anaphylactic reaction to that food or drink has happened before. Only do this under the care of a caregiver. SEEK IMMEDIATE MEDICAL CARE IF:   You have difficulty breathing, are wheezing, or have a tight feeling in your chest or throat.  You have a swollen mouth, or you have hives, swelling, or itching all over your body.  You have had a severe reaction that has responded to your anaphylaxis kit or an EpiPen. These reactions may return when the medicine has worn off. These reactions should be considered life threatening. MAKE SURE YOU:   Understand these instructions.  Will watch your condition.  Will get help right away if you are not doing well or get worse. Document Released: 11/04/2002 Document Revised: 11/03/2011 Document Reviewed: 04/10/2008 [facility] Patient Information 2014 [place], [place].   Jessica Duke

## 2013-01-23 ENCOUNTER — Encounter: Payer: Self-pay | Admitting: Family Medicine

## 2013-01-23 NOTE — Assessment & Plan Note (Addendum)
Add Lisinopril due to patient being symptomatic

## 2013-01-23 NOTE — Assessment & Plan Note (Signed)
Avoid trans fats, add a krill oil caps

## 2013-01-23 NOTE — Assessment & Plan Note (Signed)
Using Zyrtec prn with good results

## 2013-01-23 NOTE — Progress Notes (Signed)
Patient ID: Jessica Duke, female   DOB: 02/05/1956, 57 y.o.   MRN: [id number] Jessica Duke [id number] Sep 09, 1955 01/23/2013      Progress Note-Follow Up  Subjective  Chief Complaint  Chief Complaint  Patient presents with  . Follow-up    3 month    HPI  Patient is a 57 year old Asian female who is in today for followup. Generally doing well. Has been watching her blood pressures at home and seeing 120s to 130s over 90s most of time. Does acknowledge a casing having headaches when her pressure is higher and feeling slightly lightheaded. No fevers or chills. No palpitations, chest pain, shortness of breath, GI or GU complaints except for some mild gaseousness or noted.  Past Medical History  Diagnosis Date  . Thyroid disease   . Preventative health care 09/21/2012  . Ganglion cyst 09/21/2012  . Hyperlipidemia 09/21/2012  . Elevated BP 09/21/2012  . H. pylori infection 10/18/2012  . Allergic state 10/18/2012  . Pedal edema 10/18/2012    Past Surgical History  Procedure Laterality Date  . Cesarean section  1986    Family History  Problem Relation Age of Onset  . Hypertension Mother   . Other Mother     kidney problem  . Hypertension Father   . Hyperlipidemia Father   . Hypertension Brother     History   Social History  . Marital Status: Married    Spouse Name: N/A    Number of Children: N/A  . Years of Education: N/A   Occupational History  . Not on file.   Social History Main Topics  . Smoking status: Never Smoker   . Smokeless tobacco: Never Used  . Alcohol Use: Yes     Comment: occasionally  . Drug Use: No  . Sexually Active: Not on file   Other Topics Concern  . Not on file   Social History Narrative  . No narrative on file    Current Outpatient Prescriptions on File Prior to Visit  Medication Sig Dispense Refill  . Calcium Carbonate-Vit D-Min (CALCIUM 1200 PO) Take by mouth.      . cetirizine (ZYRTEC) 10 MG tablet Take 1 tablet (10 mg total) by mouth daily  as needed for allergies (conjunctivitis, for eyes).  30 tablet  5  . fish oil-omega-3 fatty acids 1000 MG capsule Take 2 g by mouth daily.      . Multiple Vitamin (MULTIVITAMIN) tablet Take 1 tablet by mouth daily.      . Probiotic Product (MISC INTESTINAL FLORA REGULAT) CAPS Digestive Advantage by schiff daily    0   No current facility-administered medications on file prior to visit.    No Known Allergies  Review of Systems  Review of Systems  Constitutional: Negative for fever and malaise/fatigue.  HENT: Negative for congestion.   Eyes: Negative for discharge.  Respiratory: Negative for shortness of breath.   Cardiovascular: Negative for chest pain, palpitations and leg swelling.  Gastrointestinal: Negative for nausea, abdominal pain and diarrhea.  Genitourinary: Negative for dysuria.  Musculoskeletal: Negative for falls.  Skin: Negative for rash.  Neurological: Negative for loss of consciousness and headaches.  Endo/Heme/Allergies: Negative for polydipsia.  Psychiatric/Behavioral: Negative for depression and suicidal ideas. The patient is not nervous/anxious and does not have insomnia.     Objective  BP 132/88  Pulse 57  Temp(Src) 97.5 F (36.4 C) (Oral)  Ht 5\' 7"  (1.702 m)  Wt 150 lb 1.9 oz (68.094 kg)  BMI 23.51 kg/m2  Physical  Exam  Physical Exam  Constitutional: She is oriented to person, place, and time and well-developed, well-nourished, and in no distress. No distress.  HENT:  Head: Normocephalic and atraumatic.  Eyes: Conjunctivae are normal.  Neck: Neck supple. No thyromegaly present.  Cardiovascular: Normal rate, regular rhythm and normal heart sounds.   No murmur heard. Pulmonary/Chest: Effort normal and breath sounds normal. She has no wheezes.  Abdominal: She exhibits no distension and no mass.  Musculoskeletal: She exhibits no edema.  Lymphadenopathy:    She has no cervical adenopathy.  Neurological: She is alert and oriented to person, place, and  time.  Skin: Skin is warm and dry. No rash noted. She is not diaphoretic.  Psychiatric: Memory, affect and judgment normal.    Lab Results  Component Value Date   TSH 0.657 01/20/2013   Lab Results  Component Value Date   WBC 5.7 01/20/2013   HGB 13.5 01/20/2013   HCT 39.7 01/20/2013   MCV 88.8 01/20/2013   PLT 184 01/20/2013   Lab Results  Component Value Date   CREATININE 0.84 01/20/2013   BUN 15 01/20/2013   NA 138 01/20/2013   K 4.3 01/20/2013   CL 104 01/20/2013   CO2 28 01/20/2013   Lab Results  Component Value Date   ALT 11 09/27/2012   AST 14 09/27/2012   ALKPHOS 42 09/27/2012   BILITOT 0.7 09/27/2012   Lab Results  Component Value Date   CHOL 226* 09/27/2012   Lab Results  Component Value Date   HDL 62 09/27/2012   Lab Results  Component Value Date   LDLCALC 144* 09/27/2012   Lab Results  Component Value Date   TRIG 98 09/27/2012   Lab Results  Component Value Date   CHOLHDL 3.6 09/27/2012     Assessment & Plan  HTN (hypertension) Add Lisinopril due to patient being symptomatic  Allergic state Using Zyrtec prn with good results  Hyperlipidemia Avoid trans fats, add a krill oil caps

## 2013-07-11 ENCOUNTER — Telehealth: Payer: Self-pay | Admitting: *Deleted

## 2013-07-11 DIAGNOSIS — R5381 Other malaise: Secondary | ICD-10-CM

## 2013-07-11 DIAGNOSIS — E039 Hypothyroidism, unspecified: Secondary | ICD-10-CM

## 2013-07-11 MED ORDER — LEVOTHYROXINE SODIUM 88 MCG PO TABS: 88.0000 ug | ORAL_TABLET | Freq: Every day | ORAL | Status: DC

## 2013-07-11 NOTE — Telephone Encounter (Signed)
Pt called and spoke with NP requesting refill of levothyroxine. 30 day supply sent to pharmacy.  Pt last seen in May and was advised follow up around 05/23/13.  Please call pt and arrange follow up.

## 2013-07-14 NOTE — Telephone Encounter (Signed)
Left detailed message informing patient of medication refill and that she is past due for a follow up visit. She needs to call our office to schedule this appointment.

## 2013-07-15 NOTE — Telephone Encounter (Signed)
Left message for patient to return my call.

## 2013-07-18 NOTE — Telephone Encounter (Signed)
Left message for patient to return my call.

## 2013-07-19 NOTE — Telephone Encounter (Signed)
Mailed letter to pt

## 2013-08-09 ENCOUNTER — Ambulatory Visit: Payer: BC Managed Care – PPO | Admitting: Family Medicine

## 2013-08-09 ENCOUNTER — Encounter: Payer: Self-pay | Admitting: Family Medicine

## 2013-08-09 ENCOUNTER — Ambulatory Visit (INDEPENDENT_AMBULATORY_CARE_PROVIDER_SITE_OTHER): Payer: BC Managed Care – PPO | Admitting: Family Medicine

## 2013-08-09 VITALS — BP 122/82 | HR 63 | Temp 98.2°F | Ht 67.0 in | Wt 156.0 lb

## 2013-08-09 DIAGNOSIS — R739 Hyperglycemia, unspecified: Secondary | ICD-10-CM

## 2013-08-09 DIAGNOSIS — E785 Hyperlipidemia, unspecified: Secondary | ICD-10-CM

## 2013-08-09 DIAGNOSIS — I10 Essential (primary) hypertension: Secondary | ICD-10-CM

## 2013-08-09 DIAGNOSIS — A048 Other specified bacterial intestinal infections: Secondary | ICD-10-CM

## 2013-08-09 DIAGNOSIS — R7309 Other abnormal glucose: Secondary | ICD-10-CM

## 2013-08-09 DIAGNOSIS — E079 Disorder of thyroid, unspecified: Secondary | ICD-10-CM

## 2013-08-09 LAB — CBC
MCH: 29.7 pg (ref 26.0–34.0)
Platelets: 217 10*3/uL (ref 150–400)
RBC: 4.34 MIL/uL (ref 3.87–5.11)

## 2013-08-09 LAB — RENAL FUNCTION PANEL
Albumin: 4.4 g/dL (ref 3.5–5.2)
BUN: 11 mg/dL (ref 6–23)
CO2: 29 mEq/L (ref 19–32)
Calcium: 9.4 mg/dL (ref 8.4–10.5)
Chloride: 102 mEq/L (ref 96–112)
Creat: 0.79 mg/dL (ref 0.50–1.10)
Phosphorus: 4 mg/dL (ref 2.3–4.6)

## 2013-08-09 LAB — HEPATIC FUNCTION PANEL
AST: 13 U/L (ref 0–37)
Bilirubin, Direct: 0.1 mg/dL (ref 0.0–0.3)
Indirect Bilirubin: 0.5 mg/dL (ref 0.0–0.9)

## 2013-08-09 LAB — LIPID PANEL
HDL: 63 mg/dL (ref >39)
LDL Cholesterol: 113 mg/dL — ABNORMAL HIGH (ref 0–99)
Total CHOL/HDL Ratio: 3.2 Ratio
VLDL: 23 mg/dL (ref 0–40)

## 2013-08-09 LAB — HEMOGLOBIN A1C
Hgb A1c MFr Bld: 5.9 % — ABNORMAL HIGH (ref <5.7)
Mean Plasma Glucose: 123 mg/dL — ABNORMAL HIGH (ref <117)

## 2013-08-09 NOTE — Progress Notes (Signed)
Pre visit review using our clinic review tool, if applicable. No additional management support is needed unless otherwise documented below in the visit note. 

## 2013-08-09 NOTE — Patient Instructions (Signed)
  Probiotic daily such as Digestive Advantage  Gastroesophageal Reflux Disease, Adult Gastroesophageal reflux disease (GERD) happens when acid from your stomach flows up into the esophagus. When acid comes in contact with the esophagus, the acid causes soreness (inflammation) in the esophagus. Over time, GERD may create small holes (ulcers) in the lining of the esophagus. CAUSES   Increased body weight. This puts pressure on the stomach, making acid rise from the stomach into the esophagus.  Smoking. This increases acid production in the stomach.  Drinking alcohol. This causes decreased pressure in the lower esophageal sphincter (valve or ring of muscle between the esophagus and stomach), allowing acid from the stomach into the esophagus.  Late evening meals and a full stomach. This increases pressure and acid production in the stomach.  A malformed lower esophageal sphincter. Sometimes, no cause is found. SYMPTOMS   Burning pain in the lower part of the mid-chest behind the breastbone and in the mid-stomach area. This may occur twice a week or more often.  Trouble swallowing.  Sore throat.  Dry cough.  Asthma-like symptoms including chest tightness, shortness of breath, or wheezing. DIAGNOSIS  Your caregiver may be able to diagnose GERD based on your symptoms. In some cases, X-rays and other tests may be done to check for complications or to check the condition of your stomach and esophagus. TREATMENT  Your caregiver may recommend over-the-counter or prescription medicines to help decrease acid production. Ask your caregiver before starting or adding any new medicines.  HOME CARE INSTRUCTIONS   Change the factors that you can control. Ask your caregiver for guidance concerning weight loss, quitting smoking, and alcohol consumption.  Avoid foods and drinks that make your symptoms worse, such as:  Caffeine or alcoholic drinks.  Chocolate.  Peppermint or mint  flavorings.  Garlic and onions.  Spicy foods.  Citrus fruits, such as oranges, lemons, or limes.  Tomato-based foods such as sauce, chili, salsa, and pizza.  Fried and fatty foods.  Avoid lying down for the 3 hours prior to your bedtime or prior to taking a nap.  Eat small, frequent meals instead of large meals.  Wear loose-fitting clothing. Do not wear anything tight around your waist that causes pressure on your stomach.  Raise the head of your bed 6 to 8 inches with wood blocks to help you sleep. Extra pillows will not help.  Only take over-the-counter or prescription medicines for pain, discomfort, or fever as directed by your caregiver.  Do not take aspirin, ibuprofen, or other nonsteroidal anti-inflammatory drugs (NSAIDs). SEEK IMMEDIATE MEDICAL CARE IF:   You have pain in your arms, neck, jaw, teeth, or back.  Your pain increases or changes in intensity or duration.  You develop nausea, vomiting, or sweating (diaphoresis).  You develop shortness of breath, or you faint.  Your vomit is green, yellow, black, or looks like coffee grounds or blood.  Your stool is red, bloody, or black. These symptoms could be signs of other problems, such as heart disease, gastric bleeding, or esophageal bleeding. MAKE SURE YOU:   Understand these instructions.  Will watch your condition.  Will get help right away if you are not doing well or get worse. Document Released: 05/21/2005 Document Revised: 11/03/2011 Document Reviewed: 02/28/2011 [facility] Patient Information 2014 [place], [place].

## 2013-08-14 ENCOUNTER — Encounter: Payer: Self-pay | Admitting: Family Medicine

## 2013-08-14 NOTE — Assessment & Plan Note (Signed)
Mild. Avoid transplants. Increase exercise. Continue fatty acid supplements

## 2013-08-14 NOTE — Assessment & Plan Note (Signed)
Well controlled no changes in meds

## 2013-08-14 NOTE — Assessment & Plan Note (Signed)
Given refill on levothyroxine today.

## 2013-08-14 NOTE — Progress Notes (Signed)
Patient ID: Jessica Duke, female   DOB: [birth year], 57 y.o.   MRN: [id number] Jessica Duke [id number] June 20, 1956 08/14/2013      Progress Note-Follow Up  Subjective  Chief Complaint  Chief Complaint  Patient presents with  . med check    HPI  Patient is a 57 year old Asian female who is in today for followup. She has recently returned from extended trip to [place] to visit family. She does swallow there she did well but has been having some increased dyspepsia recently. Describes increased gas and bloating as well as some mild nausea. No fevers or chills. No diarrhea bloody or tarry stool. Patient denied chest pain, palpitations or shortness of breath. Is taking medications as prescribed.  Past Medical History  Diagnosis Date  . Thyroid disease   . Preventative health care 09/21/2012  . Ganglion cyst 09/21/2012  . Hyperlipidemia 09/21/2012  . Elevated BP 09/21/2012  . H. pylori infection 10/18/2012  . Allergic state 10/18/2012  . Pedal edema 10/18/2012  . HTN (hypertension) 09/21/2012    Past Surgical History  Procedure Laterality Date  . Cesarean section  1986    Family History  Problem Relation Age of Onset  . Hypertension Mother   . Other Mother     kidney problem  . Hypertension Father   . Hyperlipidemia Father   . Hypertension Brother     History   Social History  . Marital Status: Married    Spouse Name: N/A    Number of Children: N/A  . Years of Education: N/A   Occupational History  . Not on file.   Social History Main Topics  . Smoking status: Never Smoker   . Smokeless tobacco: Never Used  . Alcohol Use: Yes     Comment: occasionally  . Drug Use: No  . Sexual Activity: Not on file   Other Topics Concern  . Not on file   Social History Narrative  . No narrative on file    Current Outpatient Prescriptions on File Prior to Visit  Medication Sig Dispense Refill  . Calcium Carbonate-Vit D-Min (CALCIUM 1200 PO) Take by mouth.      . cetirizine (ZYRTEC)  10 MG tablet Take 1 tablet (10 mg total) by mouth daily as needed for allergies (conjunctivitis, for eyes).  30 tablet  5  . fish oil-omega-3 fatty acids 1000 MG capsule Take 2 g by mouth daily.      . fluticasone (FLONASE) 50 MCG/ACT nasal spray Place 2 sprays into the nose daily. For eyes  48 g  1  . levothyroxine (SYNTHROID, LEVOTHROID) 88 MCG tablet Take 1 tablet (88 mcg total) by mouth daily.  30 tablet  0  . lisinopril (PRINIVIL,ZESTRIL) 5 MG tablet Take 1 tablet (5 mg total) by mouth daily.  90 tablet  1  . Multiple Vitamin (MULTIVITAMIN) tablet Take 1 tablet by mouth daily.      Marland Kitchen omeprazole (PRILOSEC) 20 MG capsule Take 1 capsule (20 mg total) by mouth daily. For stomach  90 capsule  1  . Probiotic Product (MISC INTESTINAL FLORA REGULAT) CAPS Digestive Advantage by schiff daily    0   No current facility-administered medications on file prior to visit.    No Known Allergies  Review of Systems  Review of Systems  Constitutional: Negative for fever and malaise/fatigue.  HENT: Negative for congestion.   Eyes: Negative for discharge.  Respiratory: Negative for shortness of breath.   Cardiovascular: Negative for chest pain, palpitations and leg swelling.  Gastrointestinal: Positive for nausea and abdominal pain. Negative for diarrhea.  Genitourinary: Negative for dysuria.  Musculoskeletal: Negative for falls.  Skin: Negative for rash.  Neurological: Negative for loss of consciousness and headaches.  Endo/Heme/Allergies: Negative for polydipsia.  Psychiatric/Behavioral: Negative for depression and suicidal ideas. The patient is not nervous/anxious and does not have insomnia.     Objective  BP 122/82  Pulse 63  Temp(Src) 98.2 F (36.8 C) (Oral)  Ht 5\' 7"  (1.702 m)  Wt 156 lb (70.761 kg)  BMI 24.43 kg/m2  SpO2 98%  Physical Exam  Physical Exam  Constitutional: She is oriented to person, place, and time and well-developed, well-nourished, and in no distress. No distress.   HENT:  Head: Normocephalic and atraumatic.  Eyes: Conjunctivae are normal.  Neck: Neck supple. No thyromegaly present.  Cardiovascular: Normal rate, regular rhythm and normal heart sounds.   No murmur heard. Pulmonary/Chest: Effort normal and breath sounds normal. She has no wheezes.  Abdominal: She exhibits no distension and no mass.  Musculoskeletal: She exhibits no edema.  Lymphadenopathy:    She has no cervical adenopathy.  Neurological: She is alert and oriented to person, place, and time.  Skin: Skin is warm and dry. No rash noted. She is not diaphoretic.  Psychiatric: Memory, affect and judgment normal.    Lab Results  Component Value Date   TSH 0.579 08/09/2013   Lab Results  Component Value Date   WBC 4.8 08/09/2013   HGB 12.9 08/09/2013   HCT 37.7 08/09/2013   MCV 86.9 08/09/2013   PLT 217 08/09/2013   Lab Results  Component Value Date   CREATININE 0.79 08/09/2013   BUN 11 08/09/2013   NA 138 08/09/2013   K 4.3 08/09/2013   CL 102 08/09/2013   CO2 29 08/09/2013   Lab Results  Component Value Date   ALT 10 08/09/2013   AST 13 08/09/2013   ALKPHOS 46 08/09/2013   BILITOT 0.6 08/09/2013   Lab Results  Component Value Date   CHOL 199 08/09/2013   Lab Results  Component Value Date   HDL 63 08/09/2013   Lab Results  Component Value Date   LDLCALC 113* 08/09/2013   Lab Results  Component Value Date   TRIG 115 08/09/2013   Lab Results  Component Value Date   CHOLHDL 3.2 08/09/2013     Assessment & Plan  HTN (hypertension) Well controlled no changes in meds  H. pylori infection Was previously treated in doing better with just returned from a trip to [place] and her symptoms have recurred again. She will add a probiotic and proceed with repeat H. pylori testing if symptoms persist. Does follow with [facility]  Hyperlipidemia Mild. Avoid transplants. Increase exercise. Continue fatty acid supplements  Thyroid disease Given refill on  levothyroxine today.

## 2013-08-14 NOTE — Assessment & Plan Note (Signed)
Was previously treated in doing better with just returned from a trip to [place] and her symptoms have recurred again. She will add a probiotic and proceed with repeat H. pylori testing if symptoms persist. Does follow with [facility]

## 2013-08-19 ENCOUNTER — Other Ambulatory Visit: Payer: Self-pay | Admitting: Family

## 2013-09-20 ENCOUNTER — Telehealth: Payer: Self-pay | Admitting: Family Medicine

## 2013-09-20 NOTE — Telephone Encounter (Signed)
levothyroxin  Needs 6 month rx to [facility]  Patient wants to talk to christy about her meds

## 2013-09-21 MED ORDER — LEVOTHYROXINE SODIUM 88 MCG PO TABS: ORAL_TABLET | ORAL | Status: DC

## 2013-09-21 NOTE — Telephone Encounter (Signed)
Rx request to pharmacy/SLS  

## 2013-10-13 ENCOUNTER — Ambulatory Visit (INDEPENDENT_AMBULATORY_CARE_PROVIDER_SITE_OTHER): Payer: 59 | Admitting: Physician Assistant

## 2013-10-13 ENCOUNTER — Encounter: Payer: Self-pay | Admitting: Physician Assistant

## 2013-10-13 VITALS — BP 118/86 | HR 55 | Temp 97.6°F | Resp 16 | Ht 67.0 in | Wt 156.8 lb

## 2013-10-13 DIAGNOSIS — R1013 Epigastric pain: Secondary | ICD-10-CM

## 2013-10-13 DIAGNOSIS — K3189 Other diseases of stomach and duodenum: Secondary | ICD-10-CM

## 2013-10-13 DIAGNOSIS — K219 Gastro-esophageal reflux disease without esophagitis: Secondary | ICD-10-CM

## 2013-10-13 MED ORDER — OMEPRAZOLE 20 MG PO CPDR: 20.0000 mg | DELAYED_RELEASE_CAPSULE | Freq: Every day | ORAL | Status: DC

## 2013-10-13 NOTE — Patient Instructions (Signed)
Take omeprazole daily as prescribed.  Take two TUMS at bedtime.  Avoid late-night eating.  Avoid alcohol consumption.  I do not think we need to recheck H. Pylori infection today as recent testing was negative.  I will make a referral t  Gastroenterology for further assessment.  You will be contacted by their office.  Diet for Gastroesophageal Reflux Disease, Adult Reflux (acid reflux) is when acid from your stomach flows up into the esophagus. When acid comes in contact with the esophagus, the acid causes irritation and soreness (inflammation) in the esophagus. When reflux happens often or so severely that it causes damage to the esophagus, it is called gastroesophageal reflux disease (GERD). Nutrition therapy can help ease the discomfort of GERD. FOODS OR DRINKS TO AVOID OR LIMIT  Smoking or chewing tobacco. Nicotine is one of the most potent stimulants to acid production in the gastrointestinal tract.  Caffeinated and decaffeinated coffee and black tea.  Regular or low-calorie carbonated beverages or energy drinks (caffeine-free carbonated beverages are allowed).   Strong spices, such as black pepper, white pepper, red pepper, cayenne, curry powder, and chili powder.  Peppermint or spearmint.  Chocolate.  High-fat foods, including meats and fried foods. Extra added fats including oils, butter, salad dressings, and nuts. Limit these to less than 8 tsp per day.  Fruits and vegetables if they are not tolerated, such as citrus fruits or tomatoes.  Alcohol.  Any food that seems to aggravate your condition. If you have questions regarding your diet, call your caregiver or a registered dietitian. OTHER THINGS THAT MAY HELP GERD INCLUDE:   Eating your meals slowly, in a relaxed setting.  Eating 5 to 6 small meals per day instead of 3 large meals.  Eliminating food for a period of time if it causes distress.  Not lying down until 3 hours after eating a meal.  Keeping the head of your  bed raised 6 to 9 inches (15 to 23 cm) by using a foam wedge or blocks under the legs of the bed. Lying flat may make symptoms worse.  Being physically active. Weight loss may be helpful in reducing reflux in overweight or obese adults.  Wear loose fitting clothing EXAMPLE MEAL PLAN This meal plan is approximately 2,000 calories based on https://www.[place].org/ChooseMyPlate.gov meal planning guidelines. Breakfast   cup cooked oatmeal.  1 cup strawberries.  1 cup low-fat milk.  1 oz almonds. Snack  1 cup cucumber slices.  6 oz yogurt (made from low-fat or fat-free milk). Lunch  2 slice whole-wheat bread.  2 oz sliced [place]ur[place].  2 tsp mayonnaise.  1 cup blueberries.  1 cup snap peas. Snack  6 whole-wheat crackers.  1 oz string cheese. Dinner   cup brown rice.  1 cup mixed veggies.  1 tsp olive oil.  3 oz grilled fish. Document Released: 08/11/2005 Document Revised: 11/03/2011 Document Reviewed: 06/27/2011 [facility] Patient Information 2014 [place], [place].

## 2013-10-13 NOTE — Progress Notes (Signed)
Patient presents to clinic today c/o heart burn and acid reflux that has been present over the past week.  Patient has history of GERD and an H. Pylori infection in 09/2012.  Was appropriately treated with complete resolution of symptoms.  Patient was seen in December by her PCP for GERD symptoms.  H. Pylori breath test at that time was negative for active infection.  Patient denies fever, chills, abdominal pain, nausea, vomiting, diarrhea, constipation, melena or hematochezia. Patient has taken occasional TUMS with some relief of symptoms.  Is not taking Omeprazole or Probiotic at present, although she has been prescribed both.  Has previously seen Gastroenterologist at [facility] but states she has not seen that provider in a few years.  Patient does not that wine consumption worsens her symptoms.  Past Medical History  Diagnosis Date  . Thyroid disease   . Preventative health care 09/21/2012  . Ganglion cyst 09/21/2012  . Hyperlipidemia 09/21/2012  . Elevated BP 09/21/2012  . H. pylori infection 10/18/2012  . Allergic state 10/18/2012  . Pedal edema 10/18/2012  . HTN (hypertension) 09/21/2012    Current Outpatient Prescriptions on File Prior to Visit  Medication Sig Dispense Refill  . Calcium Carbonate-Vit D-Min (CALCIUM 1200 PO) Take by mouth.      . fish oil-omega-3 fatty acids 1000 MG capsule Take 2 g by mouth daily.      . fluticasone (FLONASE) 50 MCG/ACT nasal spray Place 2 sprays into the nose daily. For eyes  48 g  1  . levothyroxine (SYNTHROID, LEVOTHROID) 88 MCG tablet TAKE 1 TABLET BY MOUTH ONCE A DAY  30 tablet  2  . Multiple Vitamin (MULTIVITAMIN) tablet Take 1 tablet by mouth daily.      . Probiotic Product (MISC INTESTINAL FLORA REGULAT) CAPS Digestive Advantage by schiff daily    0  . cetirizine (ZYRTEC) 10 MG tablet Take 1 tablet (10 mg total) by mouth daily as needed for allergies (conjunctivitis, for eyes).  30 tablet  5   No current facility-administered medications on file prior to  visit.    No Known Allergies  Family History  Problem Relation Age of Onset  . Hypertension Mother   . Other Mother     kidney problem  . Hypertension Father   . Hyperlipidemia Father   . Hypertension Brother     History   Social History  . Marital Status: Married    Spouse Name: N/A    Number of Children: N/A  . Years of Education: N/A   Social History Main Topics  . Smoking status: Never Smoker   . Smokeless tobacco: Never Used  . Alcohol Use: Yes     Comment: occasionally  . Drug Use: No  . Sexual Activity: None   Other Topics Concern  . None   Social History Narrative  . None    Review of Systems - See HPI.  All other ROS are negative.  BP 118/86  Pulse 55  Temp(Src) 97.6 F (36.4 C) (Oral)  Resp 16  Ht 5\' 7"  (1.702 m)  Wt 156 lb 12 oz (71.101 kg)  BMI 24.54 kg/m2  SpO2 98%  Physical Exam  Vitals reviewed. Constitutional: She is oriented to person, place, and time and well-developed, well-nourished, and in no distress.  HENT:  Head: Normocephalic and atraumatic.  Neck: Normal range of motion. Neck supple.  Cardiovascular: Normal rate, regular rhythm, normal heart sounds and intact distal pulses.   Pulmonary/Chest: Effort normal and breath sounds normal. No respiratory  distress. She has no wheezes. She has no rales. She exhibits no tenderness.  Abdominal: Soft. Bowel sounds are normal. She exhibits no distension and no mass. There is no tenderness. There is no rebound and no guarding.  Lymphadenopathy:    She has no cervical adenopathy.  Neurological: She is alert and oriented to person, place, and time.  Skin: Skin is warm and dry. No rash noted.  Psychiatric: Affect normal.    Recent Results (from the past 2160 hour(s))  H. PYLORI BREATH TEST     Status: None   Collection Time    08/09/13  8:44 AM      Result Value Ref Range   H. pylori Breath Test NEGATIVE  Negative   Comment:       Antimicrobials, proton pump inhibitors, and bismuth  preparations are     known to suppress H. pylori, and ingestion of these prior to H. pylori     diagnostic testing may lead to false negative results. If clinically     indicated, the test may be repeated on a new specimen obtained two     weeks after discontinuing treatment.        HEMOGLOBIN A1C     Status: Abnormal   Collection Time    08/09/13  1:43 PM      Result Value Ref Range   Hemoglobin A1C 5.9 (*) <5.7 %   Comment:                                                                            According to the ADA Clinical Practice Recommendations for 2011, when     HbA1c is used as a screening test:             >=6.5%   Diagnostic of Diabetes Mellitus                (if abnormal result is confirmed)           5.7-6.4%   Increased risk of developing Diabetes Mellitus           References:Diagnosis and Classification of Diabetes Mellitus,Diabetes     Care,2011,34(Suppl 1):S62-S69 and Standards of Medical Care in             Diabetes - 2011,Diabetes Care,2011,34 (Suppl 1):S11-S61.         Mean Plasma Glucose 123 (*) <117 mg/dL  TSH     Status: None   Collection Time    08/09/13  1:43 PM      Result Value Ref Range   TSH 0.579  0.350 - 4.500 uIU/mL  RENAL FUNCTION PANEL     Status: None   Collection Time    08/09/13  1:43 PM      Result Value Ref Range   Sodium 138  135 - 145 mEq/L   Potassium 4.3  3.5 - 5.3 mEq/L   Chloride 102  96 - 112 mEq/L   CO2 29  19 - 32 mEq/L   Glucose, Bld 94  70 - 99 mg/dL   BUN 11  6 - 23 mg/dL   Creat 6.04  5.40 - 9.81 mg/dL   Albumin 4.4  3.5 - 5.2 g/dL  Calcium 9.4  8.4 - 10.5 mg/dL   Phosphorus 4.0  2.3 - 4.6 mg/dL  HEPATIC FUNCTION PANEL     Status: None   Collection Time    08/09/13  1:43 PM      Result Value Ref Range   Total Bilirubin 0.6  0.3 - 1.2 mg/dL   Bilirubin, Direct 0.1  0.0 - 0.3 mg/dL   Indirect Bilirubin 0.5  0.0 - 0.9 mg/dL   Alkaline Phosphatase 46  39 - 117 U/L   AST 13  0 - 37 U/L   ALT 10  0 - 35 U/L    Total Protein 6.3  6.0 - 8.3 g/dL   Albumin 4.4  3.5 - 5.2 g/dL  CBC     Status: None   Collection Time    08/09/13  1:43 PM      Result Value Ref Range   WBC 4.8  4.0 - 10.5 K/uL   RBC 4.34  3.87 - 5.11 MIL/uL   Hemoglobin 12.9  12.0 - 15.0 g/dL   HCT 40.937.7  81.136.0 - 91.446.0 %   MCV 86.9  78.0 - 100.0 fL   MCH 29.7  26.0 - 34.0 pg   MCHC 34.2  30.0 - 36.0 g/dL   RDW 78.213.1  95.611.5 - 21.315.5 %   Platelets 217  150 - 400 K/uL  LIPID PANEL     Status: Abnormal   Collection Time    08/09/13  1:43 PM      Result Value Ref Range   Cholesterol 199  0 - 200 mg/dL   Comment: ATP III Classification:           < 200        mg/dL        Desirable          200 - 239     mg/dL        Borderline High          >= 240        mg/dL        High         Triglycerides 115  <150 mg/dL   HDL 63  >08>39 mg/dL   Total CHOL/HDL Ratio 3.2     VLDL 23  0 - 40 mg/dL   LDL Cholesterol 657113 (*) 0 - 99 mg/dL   Comment:       Total Cholesterol/HDL Ratio:CHD Risk                            Coronary Heart Disease Risk Table                                            Men       Women              1/2 Average Risk              3.4        3.3                  Average Risk              5.0        4.4               2X Average Risk  9.6        7.1               3X Average Risk             23.4       11.0     Use the calculated Patient Ratio above and the CHD Risk table      to determine the patient's CHD Risk.     ATP III Classification (LDL):           < 100        mg/dL         Optimal          100 - 129     mg/dL         Near or Above Optimal          130 - 159     mg/dL         Borderline High          160 - 189     mg/dL         High           > 190        mg/dL         Very High          Assessment/Plan: GERD (gastroesophageal reflux disease) Rx omeprazole.  TUMS at bedtime.  Avoid late-night eating.  Avoid alcohol consumption.  Referral to GI for recurrence of symptoms.  Exam unremarkable and recent H.  Pylori breath test was negative for active infection.  Do not feel repeat H. Pylori breath test is indicated at present.

## 2013-10-13 NOTE — Assessment & Plan Note (Addendum)
Rx omeprazole.  TUMS at bedtime.  Avoid late-night eating.  Avoid alcohol consumption.  Referral to GI for recurrence of symptoms.  Exam unremarkable and recent H. Pylori breath test was negative for active infection.  Do not feel repeat H. Pylori breath test is indicated at present.

## 2013-10-13 NOTE — Progress Notes (Signed)
Pre visit review using our clinic review tool, if applicable. No additional management support is needed unless otherwise documented below in the visit note/SLS  

## 2013-11-07 ENCOUNTER — Encounter: Payer: Self-pay | Admitting: Family Medicine

## 2013-11-07 ENCOUNTER — Ambulatory Visit (INDEPENDENT_AMBULATORY_CARE_PROVIDER_SITE_OTHER): Payer: 59 | Admitting: Family Medicine

## 2013-11-07 VITALS — BP 128/88 | HR 61 | Temp 97.7°F | Ht 67.0 in | Wt 158.1 lb

## 2013-11-07 DIAGNOSIS — R7309 Other abnormal glucose: Secondary | ICD-10-CM

## 2013-11-07 DIAGNOSIS — K219 Gastro-esophageal reflux disease without esophagitis: Secondary | ICD-10-CM

## 2013-11-07 DIAGNOSIS — R1013 Epigastric pain: Secondary | ICD-10-CM

## 2013-11-07 DIAGNOSIS — R350 Frequency of micturition: Secondary | ICD-10-CM

## 2013-11-07 DIAGNOSIS — I10 Essential (primary) hypertension: Secondary | ICD-10-CM

## 2013-11-07 DIAGNOSIS — E785 Hyperlipidemia, unspecified: Secondary | ICD-10-CM

## 2013-11-07 DIAGNOSIS — E039 Hypothyroidism, unspecified: Secondary | ICD-10-CM

## 2013-11-07 DIAGNOSIS — A048 Other specified bacterial intestinal infections: Secondary | ICD-10-CM

## 2013-11-07 DIAGNOSIS — R739 Hyperglycemia, unspecified: Secondary | ICD-10-CM | POA: Insufficient documentation

## 2013-11-07 LAB — CBC
HCT: 39.5 % (ref 36.0–46.0)
HEMOGLOBIN: 13.5 g/dL (ref 12.0–15.0)
MCH: 29.5 pg (ref 26.0–34.0)
MCHC: 34.2 g/dL (ref 30.0–36.0)
MCV: 86.4 fL (ref 78.0–100.0)
Platelets: 191 10*3/uL (ref 150–400)
RBC: 4.57 MIL/uL (ref 3.87–5.11)
RDW: 13.2 % (ref 11.5–15.5)
WBC: 4.2 10*3/uL (ref 4.0–10.5)

## 2013-11-07 LAB — HEPATIC FUNCTION PANEL
ALT: 11 U/L (ref 0–35)
AST: 13 U/L (ref 0–37)
Albumin: 4.2 g/dL (ref 3.5–5.2)
Alkaline Phosphatase: 42 U/L (ref 39–117)
Bilirubin, Direct: 0.1 mg/dL (ref 0.0–0.3)
Indirect Bilirubin: 0.8 mg/dL (ref 0.2–1.2)
Total Bilirubin: 0.9 mg/dL (ref 0.2–1.2)
Total Protein: 6.1 g/dL (ref 6.0–8.3)

## 2013-11-07 LAB — RENAL FUNCTION PANEL
Albumin: 4.2 g/dL (ref 3.5–5.2)
BUN: 16 mg/dL (ref 6–23)
CO2: 27 mEq/L (ref 19–32)
CREATININE: 0.81 mg/dL (ref 0.50–1.10)
Calcium: 9.2 mg/dL (ref 8.4–10.5)
Chloride: 104 mEq/L (ref 96–112)
Glucose, Bld: 91 mg/dL (ref 70–99)
PHOSPHORUS: 3.5 mg/dL (ref 2.3–4.6)
Potassium: 4 mEq/L (ref 3.5–5.3)
Sodium: 139 mEq/L (ref 135–145)

## 2013-11-07 LAB — LIPID PANEL
CHOLESTEROL: 217 mg/dL — AB (ref 0–200)
HDL: 61 mg/dL (ref >39)
LDL CALC: 142 mg/dL — AB (ref 0–99)
Total CHOL/HDL Ratio: 3.6 Ratio
Triglycerides: 72 mg/dL (ref <150)
VLDL: 14 mg/dL (ref 0–40)

## 2013-11-07 LAB — HEMOGLOBIN A1C
Hgb A1c MFr Bld: 5.8 % — ABNORMAL HIGH (ref <5.7)
Mean Plasma Glucose: 120 mg/dL — ABNORMAL HIGH (ref <117)

## 2013-11-07 MED ORDER — LEVOTHYROXINE SODIUM 88 MCG PO TABS: ORAL_TABLET | ORAL | Status: DC

## 2013-11-07 NOTE — Patient Instructions (Addendum)
Cholecystitis Cholecystitis is an inflammation of your gallbladder. It is usually caused by a buildup of gallstones or sludge (cholelithiasis) in your gallbladder. The gallbladder stores a fluid that helps digest fats (bile). Cholecystitis is serious and needs treatment right away.  CAUSES   Gallstones. Gallstones can block the tube that leads to your gallbladder, causing bile to build up. As bile builds up, the gallbladder becomes inflamed.  Bile duct problems, such as blockage from scarring or kinking.  Tumors. Tumors can stop bile from leaving your gallbladder correctly, causing bile to build up. As bile builds up, the gallbladder becomes inflamed. SYMPTOMS   Nausea.  Vomiting.  Abdominal pain, especially in the upper right area of your abdomen.  Abdominal tenderness or bloating.  Sweating.  Chills.  Fever.  Yellowing of the skin and the whites of the eyes (jaundice). DIAGNOSIS  Your caregiver may order blood tests to look for infection or gallbladder problems. Your caregiver may also order imaging tests, such as an ultrasound or computed tomography (CT) scan. Further tests may include a hepatobiliary iminodiacetic acid (HIDA) scan. This scan allows your caregiver to see your bile move from the liver to the gallbladder and to the small intestine. TREATMENT  A hospital stay is usually necessary to lessen the inflammation of your gallbladder. You may be required to not eat or drink (fast) for a certain amount of time. You may be given medicine to treat pain or an antibiotic medicine to treat an infection. Surgery may be needed to remove your gallbladder (cholecystectomy) once the inflammation has gone down. Surgery may be needed right away if you develop complications such as death of gallbladder tissue (gangrene) or a tear (perforation) of the gallbladder.  HOME CARE INSTRUCTIONS  Home care will depend on your treatment. In general:  If you were given antibiotics, take them as  directed. Finish them even if you start to feel better.  Only take over-the-counter or prescription medicines for pain, discomfort, or fever as directed by your caregiver.  Follow a low-fat diet until you see your caregiver again.  Keep all follow-up visits as directed by your caregiver. SEEK IMMEDIATE MEDICAL CARE IF:   Your pain is increasing and not controlled by medicines.  Your pain moves to another part of your abdomen or to your back.  You have a fever.  You have nausea and vomiting. MAKE SURE YOU:  Understand these instructions.  Will watch your condition.  Will get help right away if you are not doing well or get worse. Document Released: 08/11/2005 Document Revised: 11/03/2011 Document Reviewed: 06/27/2011 [facility] Patient Information 2014 [place], [place].   Kegel Exercises The goal of Kegel exercises is to isolate and exercise your pelvic floor muscles. These muscles act as a hammock that supports the rectum, vagina, small intestine, and uterus. As the muscles weaken, the hammock sags and these organs are displaced from their normal positions. Kegel exercises can strengthen your pelvic floor muscles and help you to improve bladder and bowel control, improve sexual response, and help reduce many problems and some discomfort during pregnancy. Kegel exercises can be done anywhere and at any time. HOW TO PERFORM KEGEL EXERCISES 1. Locate your pelvic floor muscles. To do this, squeeze (contract) the muscles that you use when you try to stop the flow of urine. You will feel a tightness in the vaginal area (women) and a tight lift in the rectal area (men and women). 2. When you begin, contract your pelvic muscles tight for 2 5  seconds, then relax them for 2 5 seconds. This is one set. Do 4 5 sets with a short pause in between. 3. Contract your pelvic muscles for 8 10 seconds, then relax them for 8 10 seconds. Do 4 5 sets. If you cannot contract your pelvic muscles for 8 10  seconds, try 5 7 seconds and work your way up to 8 10 seconds. Your goal is 4 5 sets of 10 contractions each day. Keep your stomach, buttocks, and legs relaxed during the exercises. Perform sets of both short and long contractions. Vary your positions. Perform these contractions 3 4 times per day. Perform sets while you are:   Lying in bed in the morning.  Standing at lunch.  Sitting in the late afternoon.  Lying in bed at night. You should do 40 50 contractions per day. Do not perform more Kegel exercises per day than recommended. Overexercising can cause muscle fatigue. Continue these exercises for for at least 15 20 weeks or as directed by your caregiver. Document Released: 07/28/2012 Document Reviewed: 07/28/2012 [facility] Patient Information 2014 [place], [place].

## 2013-11-07 NOTE — Assessment & Plan Note (Signed)
Negative test in December 2014

## 2013-11-07 NOTE — Assessment & Plan Note (Signed)
Well controlled, no changes to meds. Encouraged heart healthy diet such as the DASH diet and exercise as tolerated.  °

## 2013-11-07 NOTE — Assessment & Plan Note (Signed)
Urgency and low flow check UA anc culture, encouraged kegels

## 2013-11-07 NOTE — Progress Notes (Signed)
Patient ID: Jessica Duke, female   DOB: July 08, 1956, 58 y.o.   MRN: [id number] Jessica Duke [id number] July 26, 1956 11/07/2013      Progress Note-Follow Up  Subjective  Chief Complaint  Chief Complaint  Patient presents with  . Follow-up    3 month    HPI  Patient is a 58 year old female in today for routine medical care. Patient notes some trouble with increased epigastric pain and bloating. No significant diarrhea, anorexia. No vomiting. Is also noting some urinary frequency and urgency. No bloody or tarry stool. Denies any recent headaches or acute illness.Denies CP/palp/SOB/HA/congestion. Taking meds as prescribed  Past Medical History  Diagnosis Date  . Thyroid disease   . Preventative health care 09/21/2012  . Ganglion cyst 09/21/2012  . Hyperlipidemia 09/21/2012  . Elevated BP 09/21/2012  . H. pylori infection 10/18/2012  . Allergic state 10/18/2012  . Pedal edema 10/18/2012  . HTN (hypertension) 09/21/2012    Past Surgical History  Procedure Laterality Date  . Cesarean section  1986    Family History  Problem Relation Age of Onset  . Hypertension Mother   . Other Mother     kidney problem  . Hypertension Father   . Hyperlipidemia Father   . Hypertension Brother     History   Social History  . Marital Status: Married    Spouse Name: N/A    Number of Children: N/A  . Years of Education: N/A   Occupational History  . Not on file.   Social History Main Topics  . Smoking status: Never Smoker   . Smokeless tobacco: Never Used  . Alcohol Use: Yes     Comment: occasionally  . Drug Use: No  . Sexual Activity: Not on file   Other Topics Concern  . Not on file   Social History Narrative  . No narrative on file    Current Outpatient Prescriptions on File Prior to Visit  Medication Sig Dispense Refill  . Calcium Carbonate-Vit D-Min (CALCIUM 1200 PO) Take by mouth.      . cetirizine (ZYRTEC) 10 MG tablet Take 1 tablet (10 mg total) by mouth daily as needed for  allergies (conjunctivitis, for eyes).  30 tablet  5  . fish oil-omega-3 fatty acids 1000 MG capsule Take 2 g by mouth daily.      . fluticasone (FLONASE) 50 MCG/ACT nasal spray Place 2 sprays into the nose daily. For eyes  48 g  1  . levothyroxine (SYNTHROID, LEVOTHROID) 88 MCG tablet TAKE 1 TABLET BY MOUTH ONCE A DAY  30 tablet  2  . lisinopril (PRINIVIL,ZESTRIL) 5 MG tablet Take 5 mg by mouth daily as needed. Takes if Dizzy      . Multiple Vitamin (MULTIVITAMIN) tablet Take 1 tablet by mouth daily.      Marland Kitchen omeprazole (PRILOSEC) 20 MG capsule Take 1 capsule (20 mg total) by mouth daily. For stomach  90 capsule  1  . Probiotic Product (MISC INTESTINAL FLORA REGULAT) CAPS Digestive Advantage by schiff daily    0   No current facility-administered medications on file prior to visit.    No Known Allergies  Review of Systems  Review of Systems  Constitutional: Negative for fever and malaise/fatigue.  HENT: Negative for congestion.   Eyes: Negative for discharge.  Respiratory: Negative for shortness of breath.   Cardiovascular: Negative for chest pain, palpitations and leg swelling.  Gastrointestinal: Positive for heartburn, nausea and abdominal pain. Negative for vomiting, diarrhea, constipation, blood in stool  and melena.  Genitourinary: Positive for urgency and frequency. Negative for dysuria, hematuria and flank pain.  Musculoskeletal: Negative for falls.  Skin: Negative for rash.  Neurological: Negative for loss of consciousness and headaches.  Endo/Heme/Allergies: Negative for polydipsia.  Psychiatric/Behavioral: Negative for depression and suicidal ideas. The patient is not nervous/anxious and does not have insomnia.     Objective  BP 128/88  Pulse 61  Temp(Src) 97.7 F (36.5 C) (Oral)  Ht 5\' 7"  (1.702 m)  Wt 158 lb 1.9 oz (71.723 kg)  BMI 24.76 kg/m2  SpO2 95%  Physical Exam  Physical Exam  Constitutional: She is oriented to person, place, and time and well-developed,  well-nourished, and in no distress. No distress.  HENT:  Head: Normocephalic and atraumatic.  Eyes: Conjunctivae are normal.  Neck: Neck supple. No thyromegaly present.  Cardiovascular: Normal rate, regular rhythm and normal heart sounds.   No murmur heard. Pulmonary/Chest: Effort normal and breath sounds normal. She has no wheezes.  Abdominal: Soft. Bowel sounds are normal. She exhibits no distension and no mass. There is tenderness. There is no rebound and no guarding.  Pain with palp in epigastrium  Musculoskeletal: She exhibits no edema.  Lymphadenopathy:    She has no cervical adenopathy.  Neurological: She is alert and oriented to person, place, and time.  Skin: Skin is warm and dry. No rash noted. She is not diaphoretic.  Psychiatric: Memory, affect and judgment normal.    Lab Results  Component Value Date   TSH 0.579 08/09/2013   Lab Results  Component Value Date   WBC 4.8 08/09/2013   HGB 12.9 08/09/2013   HCT 37.7 08/09/2013   MCV 86.9 08/09/2013   PLT 217 08/09/2013   Lab Results  Component Value Date   CREATININE 0.79 08/09/2013   BUN 11 08/09/2013   NA 138 08/09/2013   K 4.3 08/09/2013   CL 102 08/09/2013   CO2 29 08/09/2013   Lab Results  Component Value Date   ALT 10 08/09/2013   AST 13 08/09/2013   ALKPHOS 46 08/09/2013   BILITOT 0.6 08/09/2013   Lab Results  Component Value Date   CHOL 199 08/09/2013   Lab Results  Component Value Date   HDL 63 08/09/2013   Lab Results  Component Value Date   LDLCALC 113* 08/09/2013   Lab Results  Component Value Date   TRIG 115 08/09/2013   Lab Results  Component Value Date   CHOLHDL 3.2 08/09/2013     Assessment & Plan   HTN (hypertension) Well controlled, no changes to meds. Encouraged heart healthy diet such as the DASH diet and exercise as tolerated.   H. pylori infection Negative test in December 2014  GERD (gastroesophageal reflux disease) Better with prilosec but notes increased  epigastric pain after fatty meals, proceed with abd US if no gallbladder disease [facility]. Avoid offending foods, continue probiotics  Hyperlipidemia Encouraged heart healthy diet, increase exercise, avoid trans fats, consider a krill oil cap daily  Urinary frequency Urgency and low flow check UA anc culture, encouraged kegels

## 2013-11-07 NOTE — Assessment & Plan Note (Signed)
Encouraged heart healthy diet, increase exercise, avoid trans fats, consider a krill oil cap daily 

## 2013-11-07 NOTE — Progress Notes (Signed)
Pre visit review using our clinic review tool, if applicable. No additional management support is needed unless otherwise documented below in the visit note. 

## 2013-11-07 NOTE — Assessment & Plan Note (Signed)
Better with prilosec but notes increased epigastric pain after fatty meals, proceed with abd US if no gallbladder disease [facility]. Avoid offending foods, continue probiotics

## 2013-11-08 ENCOUNTER — Ambulatory Visit (HOSPITAL_BASED_OUTPATIENT_CLINIC_OR_DEPARTMENT_OTHER): Payer: 59

## 2013-11-08 ENCOUNTER — Telehealth: Payer: Self-pay | Admitting: Family Medicine

## 2013-11-08 LAB — URINE CULTURE
Colony Count: NO GROWTH
Organism ID, Bacteria: NO GROWTH

## 2013-11-08 LAB — URINALYSIS
BILIRUBIN URINE: NEGATIVE
GLUCOSE, UA: NEGATIVE mg/dL
Hgb urine dipstick: NEGATIVE
KETONES UR: NEGATIVE mg/dL
Leukocytes, UA: NEGATIVE
Nitrite: NEGATIVE
PH: 6.5 (ref 5.0–8.0)
PROTEIN: NEGATIVE mg/dL
Specific Gravity, Urine: 1.009 (ref 1.005–1.030)
Urobilinogen, UA: 0.2 mg/dL (ref 0.0–1.0)

## 2013-11-08 LAB — TSH: TSH: 1.021 u[IU]/mL (ref 0.350–4.500)

## 2013-11-08 NOTE — Telephone Encounter (Signed)
Relevant patient education assigned to patient using Emmi. ° °

## 2013-11-09 ENCOUNTER — Ambulatory Visit (HOSPITAL_BASED_OUTPATIENT_CLINIC_OR_DEPARTMENT_OTHER): Payer: 59

## 2013-11-09 ENCOUNTER — Encounter: Payer: Self-pay | Admitting: Physician Assistant

## 2013-11-16 ENCOUNTER — Ambulatory Visit (HOSPITAL_BASED_OUTPATIENT_CLINIC_OR_DEPARTMENT_OTHER)
Admission: RE | Admit: 2013-11-16 | Discharge: 2013-11-16 | Disposition: A | Discharge status: Home / Self Care | Payer: 59 | Source: Ambulatory Visit | Attending: Family Medicine | Admitting: Family Medicine

## 2013-11-16 DIAGNOSIS — R1013 Epigastric pain: Secondary | ICD-10-CM

## 2014-01-25 ENCOUNTER — Telehealth: Payer: Self-pay | Admitting: Family Medicine

## 2014-01-25 ENCOUNTER — Other Ambulatory Visit: Payer: Self-pay | Admitting: Family Medicine

## 2014-01-25 ENCOUNTER — Ambulatory Visit: Payer: 59 | Admitting: Physician Assistant

## 2014-01-25 DIAGNOSIS — K8020 Calculus of gallbladder without cholecystitis without obstruction: Secondary | ICD-10-CM

## 2014-01-25 DIAGNOSIS — R1013 Epigastric pain: Secondary | ICD-10-CM

## 2014-01-25 DIAGNOSIS — Z0289 Encounter for other administrative examinations: Secondary | ICD-10-CM

## 2014-01-25 DIAGNOSIS — G8929 Other chronic pain: Secondary | ICD-10-CM

## 2014-01-25 NOTE — Telephone Encounter (Signed)
Patient cancelled appointment for today stating that she cannot get out of work

## 2014-01-25 NOTE — Telephone Encounter (Signed)
Result Notes    Notes Recorded by Court Joy, RMA on 01/03/2014 at 10:46 AM Mailing letter ------ Notes Recorded by Court Joy, RMA on 11/21/2013 at 10:22 AM Left a detailed message and asked pt to return our call to let us know about the referral ------ Notes Recorded by Bradd Canary, MD on 11/16/2013 at 9:03 PM Notify gALLSTONES should be referred to gen surg for removal if he is willing.   Patient reports that she never was contacted RE: referral to general surgeon for gallstones until she received a letter via mail last week. Her symptoms are stable; [No severe pain, nausea or vomiting, fever]; pt informed to have light meals w/bland diet and stay well hydrated until we can get her in with general surgeon, understood & agreed. Referral placed/SLS

## 2014-02-08 ENCOUNTER — Ambulatory Visit (INDEPENDENT_AMBULATORY_CARE_PROVIDER_SITE_OTHER): Payer: 59 | Admitting: Surgery

## 2014-02-08 ENCOUNTER — Encounter (INDEPENDENT_AMBULATORY_CARE_PROVIDER_SITE_OTHER): Payer: Self-pay | Admitting: Surgery

## 2014-02-08 VITALS — BP 138/89 | HR 79 | Temp 97.6°F | Resp 18 | Ht 67.0 in | Wt 152.8 lb

## 2014-02-08 DIAGNOSIS — K8020 Calculus of gallbladder without cholecystitis without obstruction: Secondary | ICD-10-CM | POA: Insufficient documentation

## 2014-02-08 NOTE — Progress Notes (Signed)
Patient ID: Jessica Duke, female   DOB: 10/25/1955, 58 y.o.   MRN: [id number]  Chief Complaint  Patient presents with  . Abdominal Pain    gallbladder    HPI Jessica Duke is a 58 y.o. female.   HPI This is a pleasant female referred by Dr. Rogelia RohrerBlythe for evaluation of epigastric abdominal pain. She has been having attacks of biliary colic since November of 2014. She complains of epigastric and right upper quadrant abdominal pain hurting due to her back and nausea with bloating after fatty meals. The pain is moderate in intensity. She has nausea but no vomiting. Bowel movements are normal. The pain is described as sharp. She has no other complaints. Past Medical History  Diagnosis Date  . Thyroid disease   . Preventative health care 09/21/2012  . Ganglion cyst 09/21/2012  . Hyperlipidemia 09/21/2012  . Elevated BP 09/21/2012  . H. pylori infection 10/18/2012  . Allergic state 10/18/2012  . Pedal edema 10/18/2012  . HTN (hypertension) 09/21/2012    Past Surgical History  Procedure Laterality Date  . Cesarean section  1986    Family History  Problem Relation Age of Onset  . Hypertension Mother   . Other Mother     kidney problem  . Hypertension Father   . Hyperlipidemia Father   . Hypertension Brother     Social History History  Substance Use Topics  . Smoking status: Never Smoker   . Smokeless tobacco: Never Used  . Alcohol Use: Yes     Comment: occasionally    No Known Allergies  Current Outpatient Prescriptions  Medication Sig Dispense Refill  . Calcium Carbonate-Vit D-Min (CALCIUM 1200 PO) Take by mouth.      . cetirizine (ZYRTEC) 10 MG tablet Take 1 tablet (10 mg total) by mouth daily as needed for allergies (conjunctivitis, for eyes).  30 tablet  5  . fish oil-omega-3 fatty acids 1000 MG capsule Take 2 g by mouth daily.      . fluticasone (FLONASE) 50 MCG/ACT nasal spray Place 2 sprays into the nose daily. For eyes  48 g  1  . levothyroxine (SYNTHROID, LEVOTHROID) 88 MCG  tablet TAKE 1 TABLET BY MOUTH ONCE A DAY  90 tablet  3  . lisinopril (PRINIVIL,ZESTRIL) 5 MG tablet Take 5 mg by mouth daily as needed. Takes if Dizzy      . Multiple Vitamin (MULTIVITAMIN) tablet Take 1 tablet by mouth daily.      Marland Kitchen. omeprazole (PRILOSEC) 20 MG capsule Take 1 capsule (20 mg total) by mouth daily. For stomach  90 capsule  1  . Probiotic Product (MISC INTESTINAL FLORA REGULAT) CAPS Digestive Advantage by schiff daily    0   No current facility-administered medications for this visit.    Review of Systems Review of Systems  Constitutional: Negative for fever, chills and unexpected weight change.  HENT: Negative for congestion, hearing loss, sore throat, trouble swallowing and voice change.   Eyes: Negative for visual disturbance.  Respiratory: Negative for cough and wheezing.   Cardiovascular: Negative for chest pain, palpitations and leg swelling.  Gastrointestinal: Positive for nausea, abdominal pain and abdominal distention. Negative for vomiting, diarrhea, constipation, blood in stool and anal bleeding.  Genitourinary: Negative for hematuria, vaginal bleeding and difficulty urinating.  Musculoskeletal: Negative for arthralgias.  Skin: Negative for rash and wound.  Neurological: Negative for seizures, syncope and headaches.  Hematological: Negative for adenopathy. Does not bruise/bleed easily.  Psychiatric/Behavioral: Negative for confusion.    Blood pressure  138/89, pulse 79, temperature 97.6 F (36.4 C), temperature source Temporal, resp. rate 18, height 5\' 7"  (1.702 m), weight 152 lb 12.8 oz (69.31 kg).  Physical Exam Physical Exam  Constitutional: She is oriented to person, place, and time. She appears well-developed and well-nourished. No distress.  HENT:  Head: Normocephalic and atraumatic.  Right Ear: External ear normal.  Left Ear: External ear normal.  Nose: Nose normal.  Mouth/Throat: Oropharynx is clear and moist. No oropharyngeal exudate.  Eyes:  Conjunctivae are normal. Pupils are equal, round, and reactive to light. Right eye exhibits no discharge. Left eye exhibits no discharge. No scleral icterus.  Neck: Normal range of motion. Neck supple. No tracheal deviation present.  Cardiovascular: Normal rate, regular rhythm, normal heart sounds and intact distal pulses.   No murmur heard. Pulmonary/Chest: Effort normal. No respiratory distress. She has no wheezes. She has no rales.  Abdominal: Soft. Bowel sounds are normal. She exhibits no distension. There is tenderness. There is guarding. There is no rebound.  There is mild tenderness with guarding in the epigastrium and right upper quadrant  Musculoskeletal: Normal range of motion. She exhibits no edema and no tenderness.  Lymphadenopathy:    She has no cervical adenopathy.  Neurological: She is alert and oriented to person, place, and time.  Skin: Skin is warm and dry. No rash noted. She is not diaphoretic. No erythema.  Psychiatric: Her behavior is normal. Judgment normal.    Data Reviewed Her ultrasound shows gallstones and a slightly thickened gallbladder wall. Liver function tests are normal  Assessment    Symptomatic cholelithiasis     Plan    Laparoscopic cholecystectomy is recommended. I discussed the procedure in detail.  The patient was given Agricultural engineereducational material.  We discussed the risks and benefits of a laparoscopic cholecystectomy and possible cholangiogram including, but not limited to bleeding, infection, injury to surrounding structures such as the intestine or liver, bile leak, retained gallstones, need to convert to an open procedure, prolonged diarrhea, blood clots such as  DVT, common bile duct injury, anesthesia risks, and possible need for additional procedures.  The likelihood of improvement in symptoms and return to the patient's normal status is good. We discussed the typical post-operative recovery course.        BLACKMAN,DOUGLAS A 02/08/2014, 4:46  PM

## 2014-02-10 ENCOUNTER — Encounter: Payer: Self-pay | Admitting: Physician Assistant

## 2014-02-10 ENCOUNTER — Ambulatory Visit (INDEPENDENT_AMBULATORY_CARE_PROVIDER_SITE_OTHER): Payer: 59 | Admitting: Physician Assistant

## 2014-02-10 VITALS — BP 130/86 | HR 59 | Temp 98.5°F | Resp 16 | Ht 67.0 in | Wt 156.0 lb

## 2014-02-10 DIAGNOSIS — J209 Acute bronchitis, unspecified: Secondary | ICD-10-CM

## 2014-02-10 MED ORDER — HYDROCOD POLST-CHLORPHEN POLST 10-8 MG/5ML PO LQCR: 5.0000 mL | Freq: Two times a day (BID) | ORAL | Status: DC | PRN

## 2014-02-10 MED ORDER — AZITHROMYCIN 250 MG PO TABS: ORAL_TABLET | ORAL | Status: DC

## 2014-02-10 NOTE — Patient Instructions (Signed)
Please take antibiotic as directed.  Use Tussionex as directed for cough.  Increase your fluid intake.  Continue allergy medications.  Place a humidifier in the bedroom.  Get plenty of rest.  Call or return to clinic if symptoms are not improving.

## 2014-02-10 NOTE — Progress Notes (Signed)
Pre visit review using our clinic review tool, if applicable. No additional management support is needed unless otherwise documented below in the visit note/SLS  

## 2014-02-10 NOTE — Progress Notes (Signed)
Patient presents to clinic today c/o productive cough, chest congestion and post-nasal drip x 2 weeks.  Denies fever, chills, SOB or wheezing.  Denies sinus pressure, sinus pain, ear pain or tooth pain.  Denies recent travel or sick contact.  Past Medical History  Diagnosis Date  . Thyroid disease   . Preventative health care 09/21/2012  . Ganglion cyst 09/21/2012  . Hyperlipidemia 09/21/2012  . Elevated BP 09/21/2012  . H. pylori infection 10/18/2012  . Allergic state 10/18/2012  . Pedal edema 10/18/2012  . HTN (hypertension) 09/21/2012    Current Outpatient Prescriptions on File Prior to Visit  Medication Sig Dispense Refill  . Calcium Carbonate-Vit D-Min (CALCIUM 1200 PO) Take by mouth.      . cetirizine (ZYRTEC) 10 MG tablet Take 1 tablet (10 mg total) by mouth daily as needed for allergies (conjunctivitis, for eyes).  30 tablet  5  . fish oil-omega-3 fatty acids 1000 MG capsule Take 2 g by mouth daily.      . fluticasone (FLONASE) 50 MCG/ACT nasal spray Place 2 sprays into the nose daily. For eyes  48 g  1  . levothyroxine (SYNTHROID, LEVOTHROID) 88 MCG tablet TAKE 1 TABLET BY MOUTH ONCE A DAY  90 tablet  3  . lisinopril (PRINIVIL,ZESTRIL) 5 MG tablet Take 5 mg by mouth daily as needed. Takes if Dizzy      . Multiple Vitamin (MULTIVITAMIN) tablet Take 1 tablet by mouth daily.      Marland Kitchen. omeprazole (PRILOSEC) 20 MG capsule Take 1 capsule (20 mg total) by mouth daily. For stomach  90 capsule  1  . Probiotic Product (MISC INTESTINAL FLORA REGULAT) CAPS Digestive Advantage by schiff daily    0   No current facility-administered medications on file prior to visit.    No Known Allergies  Family History  Problem Relation Age of Onset  . Hypertension Mother   . Other Mother     kidney problem  . Hypertension Father   . Hyperlipidemia Father   . Hypertension Brother     History   Social History  . Marital Status: Married    Spouse Name: N/A    Number of Children: N/A  . Years of  Education: N/A   Social History Main Topics  . Smoking status: Never Smoker   . Smokeless tobacco: Never Used  . Alcohol Use: Yes     Comment: occasionally  . Drug Use: No  . Sexual Activity: None   Other Topics Concern  . None   Social History Narrative  . None   Review of Systems - See HPI.  All other ROS are negative.  BP 130/86  Pulse 59  Temp(Src) 98.5 F (36.9 C) (Oral)  Resp 16  Ht 5\' 7"  (1.702 m)  Wt 156 lb (70.761 kg)  BMI 24.43 kg/m2  SpO2 97%  Physical Exam  Vitals reviewed. Constitutional: She is oriented to person, place, and time and well-developed, well-nourished, and in no distress.  HENT:  Head: Normocephalic and atraumatic.  Right Ear: External ear normal.  Left Ear: External ear normal.  Nose: Nose normal.  Mouth/Throat: Oropharynx is clear and moist. No oropharyngeal exudate.  TM within normal limits bilaterally.  No TTP of sinuses noted.  Eyes: Conjunctivae are normal. Pupils are equal, round, and reactive to light.  Neck: Neck supple.  Cardiovascular: Normal rate, regular rhythm, normal heart sounds and intact distal pulses.   Pulmonary/Chest: Effort normal and breath sounds normal. No respiratory distress. She has no  wheezes. She has no rales. She exhibits no tenderness.  Lymphadenopathy:    She has no cervical adenopathy.  Neurological: She is alert and oriented to person, place, and time.  Skin: Skin is warm and dry. No rash noted.  Psychiatric: Affect normal.   Assessment/Plan: Acute bronchitis Rx azithromycin.  Rx Tussionex for cough.  Increase fluids. Rest.  Plain Mucinex. Probiotic.  Place humidifier in the bedroom.  Return precautions given to patient.

## 2014-02-15 DIAGNOSIS — J209 Acute bronchitis, unspecified: Secondary | ICD-10-CM | POA: Insufficient documentation

## 2014-02-15 NOTE — Assessment & Plan Note (Signed)
Rx azithromycin.  Rx Tussionex for cough.  Increase fluids. Rest.  Plain Mucinex. Probiotic.  Place humidifier in the bedroom.  Return precautions given to patient.

## 2014-03-15 NOTE — Pre-Procedure Instructions (Signed)
Jessica Duke  03/15/2014   Your procedure is scheduled on:  Thursday, March 23, 2014  Report to [facility] Admitting at 8:30 AM.  Call this number if you have problems the morning of surgery: [phone]   Remember:   Do not eat food or drink liquids after midnight Wednesday, March 22, 2014   Take these medicines the morning of surgery with A SIP OF WATER:  levothyroxine (SYNTHROID) Stop taking Aspirin, vitamins, and herbal medications ( Fish oil). Do not take any NSAIDs ie: Ibuprofen, Advil, Naproxen or any medication containing Aspirin.  Do not wear jewelry, make-up or nail polish.  Do not wear lotions, powders, or perfumes. You may NOT wear deodorant.  Do not shave 48 hours prior to surgery.   Do not bring valuables to the hospital.  [facility] is not responsible for any belongings or valuables.               Contacts, dentures or bridgework may not be worn into surgery.  Leave suitcase in the car. After surgery it may be brought to your room.  For patients admitted to the hospital, discharge time is determined by your treatment team.               Patients discharged the day of surgery will not be allowed to drive home.  Name and phone number of your driver: SPOUSE  Special Instructions:  Special Instructions:Special Instructions: [facility] - Preparing for Surgery  Before surgery, you can play an important role.  Because skin is not sterile, your skin needs to be as free of germs as possible.  You can reduce the number of germs on you skin by washing with CHG (chlorahexidine gluconate) soap before surgery.  CHG is an antiseptic cleaner which kills germs and bonds with the skin to continue killing germs even after washing.  Please DO NOT use if you have an allergy to CHG or antibacterial soaps.  If your skin becomes reddened/irritated stop using the CHG and inform your nurse when you arrive at Short Stay.  Do not shave (including legs and underarms) for at least 48 hours  prior to the first CHG shower.  You may shave your face.  Please follow these instructions carefully:   1.  Shower with CHG Soap the night before surgery and the morning of Surgery.  2.  If you choose to wash your hair, wash your hair first as usual with your normal shampoo.  3.  After you shampoo, rinse your hair and body thoroughly to remove the Shampoo.  4.  Use CHG as you would any other liquid soap.  You can apply chg directly  to the skin and wash gently with scrungie or a clean washcloth.  5.  Apply the CHG Soap to your body ONLY FROM THE NECK DOWN.  Do not use on open wounds or open sores.  Avoid contact with your eyes, ears, mouth and genitals (private parts).  Wash genitals (private parts) with your normal soap.  6.  Wash thoroughly, paying special attention to the area where your surgery will be performed.  7.  Thoroughly rinse your body with warm water from the neck down.  8.  DO NOT shower/wash with your normal soap after using and rinsing off the CHG Soap.  9.  Pat yourself dry with a clean towel.            10.  Wear clean pajamas.  11.  Place clean sheets on your bed the night of your first shower and do not sleep with pets.  Day of Surgery  Do not apply any lotions/deodorants the morning of surgery.  Please wear clean clothes to the hospital/surgery center.   Please read over the following fact sheets that you were given: Pain Booklet, Coughing and Deep Breathing and Surgical Site Infection Prevention

## 2014-03-16 ENCOUNTER — Encounter (HOSPITAL_COMMUNITY): Payer: Self-pay

## 2014-03-16 ENCOUNTER — Encounter (HOSPITAL_COMMUNITY)
Admission: RE | Admit: 2014-03-16 | Discharge: 2014-03-16 | Disposition: A | Discharge status: Home / Self Care | Payer: 59 | Source: Ambulatory Visit | Attending: Surgery | Admitting: Surgery

## 2014-03-16 ENCOUNTER — Encounter (HOSPITAL_COMMUNITY): Payer: Self-pay | Admitting: Pharmacy Technician

## 2014-03-16 DIAGNOSIS — K8020 Calculus of gallbladder without cholecystitis without obstruction: Secondary | ICD-10-CM | POA: Insufficient documentation

## 2014-03-16 DIAGNOSIS — E039 Hypothyroidism, unspecified: Secondary | ICD-10-CM | POA: Insufficient documentation

## 2014-03-16 DIAGNOSIS — I10 Essential (primary) hypertension: Secondary | ICD-10-CM | POA: Insufficient documentation

## 2014-03-16 DIAGNOSIS — Z01812 Encounter for preprocedural laboratory examination: Secondary | ICD-10-CM | POA: Insufficient documentation

## 2014-03-16 DIAGNOSIS — Z01818 Encounter for other preprocedural examination: Secondary | ICD-10-CM | POA: Insufficient documentation

## 2014-03-16 DIAGNOSIS — E785 Hyperlipidemia, unspecified: Secondary | ICD-10-CM | POA: Insufficient documentation

## 2014-03-16 DIAGNOSIS — Z01810 Encounter for preprocedural cardiovascular examination: Secondary | ICD-10-CM | POA: Insufficient documentation

## 2014-03-16 HISTORY — DX: Calculus of gallbladder without cholecystitis without obstruction: K80.20

## 2014-03-16 HISTORY — DX: Respiratory tuberculosis unspecified: A15.9

## 2014-03-16 HISTORY — DX: Hypothyroidism, unspecified: E03.9

## 2014-03-16 LAB — CBC
HCT: 38.9 % (ref 36.0–46.0)
HEMOGLOBIN: 13.2 g/dL (ref 12.0–15.0)
MCH: 30.2 pg (ref 26.0–34.0)
MCHC: 33.9 g/dL (ref 30.0–36.0)
MCV: 89 fL (ref 78.0–100.0)
Platelets: 169 10*3/uL (ref 150–400)
RBC: 4.37 MIL/uL (ref 3.87–5.11)
RDW: 12.3 % (ref 11.5–15.5)
WBC: 4.4 10*3/uL (ref 4.0–10.5)

## 2014-03-16 LAB — BASIC METABOLIC PANEL
ANION GAP: 11 (ref 5–15)
BUN: 15 mg/dL (ref 6–23)
CHLORIDE: 105 meq/L (ref 96–112)
CO2: 26 mEq/L (ref 19–32)
Calcium: 9 mg/dL (ref 8.4–10.5)
Creatinine, Ser: 0.77 mg/dL (ref 0.50–1.10)
GFR calc non Af Amer: >90 mL/min (ref >90)
Glucose, Bld: 100 mg/dL — ABNORMAL HIGH (ref 70–99)
POTASSIUM: 4.1 meq/L (ref 3.7–5.3)
Sodium: 142 mEq/L (ref 137–147)

## 2014-03-16 NOTE — Progress Notes (Signed)
No Previous EKG in [place] area.  Pt. Moved here fr. [place] 4 yrs. Ago.  Pt. Reports that she hasn't had any problem with her chest or heart.   She reports that her only problem is abdominal pain related to gallstones & that the only surgery she has ever had was C/Section 28 yrs. Ago for birth of her daughter. Pt. [facility] by [facility], states that she hasn't ever had any advanced testing on her heart. Call to A. Zelenak,PA_C to report EKG reading, no comparison EKG avail .

## 2014-03-17 NOTE — Progress Notes (Signed)
Anesthesia chart review: Patient is a 58 year old female scheduled for laparoscopic cholecystectomy on 03/23/14 by Dr. Abigail Miyamotoouglas Blackman.  History includes nonsmoker, hypertension, hypothyroidism, history of + PPD, hyperlipidemia, gallstones, cesarean section. PCP is Dr. Danise EdgeStacey Blyth with [facility] who referred patient to [facility].  EKG on 03/16/14 showed SB at 53 bpm, septal infarct (age undetermined.  No previous tracing found. She moved from [place] 4 years ago. No prior stress/echo.  At PAT she denies "any problem with her chest or heart."  Preoperative CXR and labs noted.   Further evaluation by her assigned anesthesiologist on the day of surgery. No acute changes or new CV symptoms then I would anticipate she can proceed as planned.  Velna Ochsllison Zelenak, PA-C [facility]/Anesthesiology Phone [phone] 03/17/2014 11:24 AM

## 2014-03-22 MED ORDER — CEFAZOLIN SODIUM-DEXTROSE 2-3 GM-% IV SOLR
2.0000 g | INTRAVENOUS | Status: AC
  Administered 2014-03-23: 2 g via INTRAVENOUS
  Filled 2014-03-22: qty 50

## 2014-03-22 NOTE — Progress Notes (Signed)
Spoke with pt personally to arrive at 8 am, voices understanding of instruction.

## 2014-03-22 NOTE — H&P (Signed)
Chief Complaint   Patient presents with   .  Abdominal Pain     gallbladder   HPI  Jessica Duke is a 58 y.o. female.  HPI  This is a pleasant female referred by Dr. Rogelia RohrerBlythe for evaluation of epigastric abdominal pain. She has been having attacks of biliary colic since November of 2014. She complains of epigastric and right upper quadrant abdominal pain hurting due to her back and nausea with bloating after fatty meals. The pain is moderate in intensity. She has nausea but no vomiting. Bowel movements are normal. The pain is described as sharp. She has no other complaints.  Past Medical History   Diagnosis  Date   .  Thyroid disease    .  Preventative health care  09/21/2012   .  Ganglion cyst  09/21/2012   .  Hyperlipidemia  09/21/2012   .  Elevated BP  09/21/2012   .  H. pylori infection  10/18/2012   .  Allergic state  10/18/2012   .  Pedal edema  10/18/2012   .  HTN (hypertension)  09/21/2012    Past Surgical History   Procedure  Laterality  Date   .  Cesarean section   1986    Family History   Problem  Relation  Age of Onset   .  Hypertension  Mother    .  Other  Mother      kidney problem   .  Hypertension  Father    .  Hyperlipidemia  Father    .  Hypertension  Brother    Social History  History   Substance Use Topics   .  Smoking status:  Never Smoker   .  Smokeless tobacco:  Never Used   .  Alcohol Use:  Yes      Comment: occasionally   No Known Allergies  Current Outpatient Prescriptions   Medication  Sig  Dispense  Refill   .  Calcium Carbonate-Vit D-Min (CALCIUM 1200 PO)  Take by mouth.     .  cetirizine (ZYRTEC) 10 MG tablet  Take 1 tablet (10 mg total) by mouth daily as needed for allergies (conjunctivitis, for eyes).  30 tablet  5   .  fish oil-omega-3 fatty acids 1000 MG capsule  Take 2 g by mouth daily.     .  fluticasone (FLONASE) 50 MCG/ACT nasal spray  Place 2 sprays into the nose daily. For eyes  48 g  1   .  levothyroxine (SYNTHROID, LEVOTHROID) 88 MCG  tablet  TAKE 1 TABLET BY MOUTH ONCE A DAY  90 tablet  3   .  lisinopril (PRINIVIL,ZESTRIL) 5 MG tablet  Take 5 mg by mouth daily as needed. Takes if Dizzy     .  Multiple Vitamin (MULTIVITAMIN) tablet  Take 1 tablet by mouth daily.     Marland Kitchen.  omeprazole (PRILOSEC) 20 MG capsule  Take 1 capsule (20 mg total) by mouth daily. For stomach  90 capsule  1   .  Probiotic Product (MISC INTESTINAL FLORA REGULAT) CAPS  Digestive Advantage by schiff daily   0    No current facility-administered medications for this visit.   Review of Systems  Review of Systems  Constitutional: Negative for fever, chills and unexpected weight change.  HENT: Negative for congestion, hearing loss, sore throat, trouble swallowing and voice change.  Eyes: Negative for visual disturbance.  Respiratory: Negative for cough and wheezing.  Cardiovascular: Negative for chest pain, palpitations and  leg swelling.  Gastrointestinal: Positive for nausea, abdominal pain and abdominal distention. Negative for vomiting, diarrhea, constipation, blood in stool and anal bleeding.  Genitourinary: Negative for hematuria, vaginal bleeding and difficulty urinating.  Musculoskeletal: Negative for arthralgias.  Skin: Negative for rash and wound.  Neurological: Negative for seizures, syncope and headaches.  Hematological: Negative for adenopathy. Does not bruise/bleed easily.  Psychiatric/Behavioral: Negative for confusion.  Blood pressure 138/89, pulse 79, temperature 97.6 F (36.4 C), temperature source Temporal, resp. rate 18, height 5\' 7"  (1.702 m), weight 152 lb 12.8 oz (69.31 kg).  Physical Exam  Physical Exam  Constitutional: She is oriented to person, place, and time. She appears well-developed and well-nourished. No distress.  HENT:  Head: Normocephalic and atraumatic.  Right Ear: External ear normal.  Left Ear: External ear normal.  Nose: Nose normal.  Mouth/Throat: Oropharynx is clear and moist. No oropharyngeal exudate.  Eyes:  Conjunctivae are normal. Pupils are equal, round, and reactive to light. Right eye exhibits no discharge. Left eye exhibits no discharge. No scleral icterus.  Neck: Normal range of motion. Neck supple. No tracheal deviation present.  Cardiovascular: Normal rate, regular rhythm, normal heart sounds and intact distal pulses.  No murmur heard.  Pulmonary/Chest: Effort normal. No respiratory distress. She has no wheezes. She has no rales.  Abdominal: Soft. Bowel sounds are normal. She exhibits no distension. There is tenderness. There is guarding. There is no rebound.  There is mild tenderness with guarding in the epigastrium and right upper quadrant  Musculoskeletal: Normal range of motion. She exhibits no edema and no tenderness.  Lymphadenopathy:  She has no cervical adenopathy.  Neurological: She is alert and oriented to person, place, and time.  Skin: Skin is warm and dry. No rash noted. She is not diaphoretic. No erythema.  Psychiatric: Her behavior is normal. Judgment normal.  Data Reviewed  Her ultrasound shows gallstones and a slightly thickened gallbladder wall. Liver function tests are normal  Assessment  Symptomatic cholelithiasis  Plan  Laparoscopic cholecystectomy is recommended. I discussed the procedure in detail. The patient was given Agricultural engineer. We discussed the risks and benefits of a laparoscopic cholecystectomy and possible cholangiogram including, but not limited to bleeding, infection, injury to surrounding structures such as the intestine or liver, bile leak, retained gallstones, need to convert to an open procedure, prolonged diarrhea, blood clots such as DVT, common bile duct injury, anesthesia risks, and possible need for additional procedures. The likelihood of improvement in symptoms and return to the patient's normal status is good. We discussed the typical post-operative recovery course.

## 2014-03-23 ENCOUNTER — Encounter (HOSPITAL_COMMUNITY): Payer: 59 | Admitting: Vascular Surgery

## 2014-03-23 ENCOUNTER — Ambulatory Visit (HOSPITAL_COMMUNITY): Payer: 59 | Admitting: Anesthesiology

## 2014-03-23 ENCOUNTER — Encounter (HOSPITAL_COMMUNITY)
Admission: RE | Disposition: A | Discharge status: Home / Self Care | Payer: Self-pay | Source: Ambulatory Visit | Attending: Surgery

## 2014-03-23 ENCOUNTER — Ambulatory Visit (HOSPITAL_COMMUNITY)
Admission: RE | Admit: 2014-03-23 | Discharge: 2014-03-23 | Disposition: A | Discharge status: Home / Self Care | Payer: 59 | Source: Ambulatory Visit | Attending: Surgery | Admitting: Surgery

## 2014-03-23 ENCOUNTER — Encounter (HOSPITAL_COMMUNITY): Payer: Self-pay | Admitting: Certified Registered"

## 2014-03-23 DIAGNOSIS — K8010 Calculus of gallbladder with chronic cholecystitis without obstruction: Secondary | ICD-10-CM | POA: Diagnosis not present

## 2014-03-23 DIAGNOSIS — E039 Hypothyroidism, unspecified: Secondary | ICD-10-CM | POA: Insufficient documentation

## 2014-03-23 DIAGNOSIS — Z79899 Other long term (current) drug therapy: Secondary | ICD-10-CM | POA: Insufficient documentation

## 2014-03-23 DIAGNOSIS — K219 Gastro-esophageal reflux disease without esophagitis: Secondary | ICD-10-CM | POA: Insufficient documentation

## 2014-03-23 DIAGNOSIS — I10 Essential (primary) hypertension: Secondary | ICD-10-CM | POA: Insufficient documentation

## 2014-03-23 DIAGNOSIS — K8020 Calculus of gallbladder without cholecystitis without obstruction: Secondary | ICD-10-CM | POA: Diagnosis present

## 2014-03-23 HISTORY — PX: CHOLECYSTECTOMY: SHX55

## 2014-03-23 SURGERY — LAPAROSCOPIC CHOLECYSTECTOMY
Anesthesia: General | Site: Abdomen

## 2014-03-23 MED ORDER — PROMETHAZINE HCL 25 MG/ML IJ SOLN
INTRAMUSCULAR | Status: AC
  Administered 2014-03-23: 6.25 mg
  Filled 2014-03-23: qty 1

## 2014-03-23 MED ORDER — SUCCINYLCHOLINE CHLORIDE 20 MG/ML IJ SOLN
INTRAMUSCULAR | Status: AC
Start: 2014-03-23 — End: 2014-03-23
  Filled 2014-03-23: qty 1

## 2014-03-23 MED ORDER — FENTANYL CITRATE 0.05 MG/ML IJ SOLN
INTRAMUSCULAR | Status: DC | PRN
  Administered 2014-03-23: 50 ug via INTRAVENOUS
  Administered 2014-03-23: 100 ug via INTRAVENOUS

## 2014-03-23 MED ORDER — PROPOFOL 10 MG/ML IV BOLUS
INTRAVENOUS | Status: DC | PRN
  Administered 2014-03-23 (×2): 20 mg via INTRAVENOUS
  Administered 2014-03-23: 150 mg via INTRAVENOUS

## 2014-03-23 MED ORDER — MIDAZOLAM HCL 2 MG/2ML IJ SOLN
INTRAMUSCULAR | Status: AC
  Filled 2014-03-23: qty 2

## 2014-03-23 MED ORDER — SUCCINYLCHOLINE CHLORIDE 20 MG/ML IJ SOLN
INTRAMUSCULAR | Status: DC | PRN
  Administered 2014-03-23: 100 mg via INTRAVENOUS
  Administered 2014-03-23: 20 mg via INTRAVENOUS

## 2014-03-23 MED ORDER — BUPIVACAINE-EPINEPHRINE 0.25% -1:200000 IJ SOLN
INTRAMUSCULAR | Status: DC | PRN
  Administered 2014-03-23: 20 mL

## 2014-03-23 MED ORDER — LIDOCAINE HCL 4 % MT SOLN
OROMUCOSAL | Status: DC | PRN
  Administered 2014-03-23: 4 mL via TOPICAL

## 2014-03-23 MED ORDER — 0.9 % SODIUM CHLORIDE (POUR BTL) OPTIME
TOPICAL | Status: DC | PRN
  Administered 2014-03-23: 1000 mL

## 2014-03-23 MED ORDER — MIDAZOLAM HCL 5 MG/5ML IJ SOLN
INTRAMUSCULAR | Status: DC | PRN
  Administered 2014-03-23: 2 mg via INTRAVENOUS

## 2014-03-23 MED ORDER — ONDANSETRON HCL 4 MG/2ML IJ SOLN
4.0000 mg | Freq: Once | INTRAMUSCULAR | Status: AC | PRN
  Administered 2014-03-23: 4 mg via INTRAVENOUS

## 2014-03-23 MED ORDER — PROMETHAZINE HCL 25 MG/ML IJ SOLN: 6.2500 mg | Freq: Four times a day (QID) | INTRAMUSCULAR | Status: DC | PRN

## 2014-03-23 MED ORDER — ONDANSETRON HCL 4 MG/2ML IJ SOLN
INTRAMUSCULAR | Status: AC
  Filled 2014-03-23: qty 2

## 2014-03-23 MED ORDER — EPHEDRINE SULFATE 50 MG/ML IJ SOLN
INTRAMUSCULAR | Status: DC | PRN
  Administered 2014-03-23: 15 mg via INTRAVENOUS

## 2014-03-23 MED ORDER — KETOROLAC TROMETHAMINE 30 MG/ML IJ SOLN
INTRAMUSCULAR | Status: AC
  Filled 2014-03-23: qty 1

## 2014-03-23 MED ORDER — PROPOFOL 10 MG/ML IV BOLUS
INTRAVENOUS | Status: AC
  Filled 2014-03-23: qty 20

## 2014-03-23 MED ORDER — SODIUM CHLORIDE 0.9 % IR SOLN
Status: DC | PRN
  Administered 2014-03-23: 1000 mL

## 2014-03-23 MED ORDER — BUPIVACAINE-EPINEPHRINE (PF) 0.25% -1:200000 IJ SOLN
INTRAMUSCULAR | Status: AC
  Filled 2014-03-23: qty 30

## 2014-03-23 MED ORDER — ARTIFICIAL TEARS OP OINT
TOPICAL_OINTMENT | OPHTHALMIC | Status: AC
  Filled 2014-03-23: qty 3.5

## 2014-03-23 MED ORDER — OXYCODONE HCL 5 MG PO TABS: 5.0000 mg | ORAL_TABLET | Freq: Once | ORAL | Status: DC | PRN

## 2014-03-23 MED ORDER — HYDROCODONE-ACETAMINOPHEN 5-325 MG PO TABS: 1.0000 | ORAL_TABLET | ORAL | Status: DC | PRN

## 2014-03-23 MED ORDER — ARTIFICIAL TEARS OP OINT
TOPICAL_OINTMENT | OPHTHALMIC | Status: DC | PRN
  Administered 2014-03-23: 1 via OPHTHALMIC

## 2014-03-23 MED ORDER — EPHEDRINE SULFATE 50 MG/ML IJ SOLN
INTRAMUSCULAR | Status: AC
  Filled 2014-03-23: qty 1

## 2014-03-23 MED ORDER — LIDOCAINE HCL (CARDIAC) 20 MG/ML IV SOLN
INTRAVENOUS | Status: DC | PRN
  Administered 2014-03-23: 20 mg via INTRAVENOUS

## 2014-03-23 MED ORDER — LACTATED RINGERS IV SOLN
INTRAVENOUS | Status: DC
  Administered 2014-03-23: 08:00:00 via INTRAVENOUS

## 2014-03-23 MED ORDER — GLYCOPYRROLATE 0.2 MG/ML IJ SOLN
INTRAMUSCULAR | Status: AC
Start: 2014-03-23 — End: 2014-03-23
  Filled 2014-03-23: qty 2

## 2014-03-23 MED ORDER — ONDANSETRON HCL 4 MG/2ML IJ SOLN
INTRAMUSCULAR | Status: DC | PRN
  Administered 2014-03-23: 4 mg via INTRAVENOUS

## 2014-03-23 MED ORDER — OXYCODONE HCL 5 MG/5ML PO SOLN: 5.0000 mg | Freq: Once | ORAL | Status: DC | PRN

## 2014-03-23 MED ORDER — HYDROMORPHONE HCL PF 1 MG/ML IJ SOLN
0.2500 mg | INTRAMUSCULAR | Status: DC | PRN
  Administered 2014-03-23 (×2): 0.25 mg via INTRAVENOUS

## 2014-03-23 MED ORDER — KETOROLAC TROMETHAMINE 30 MG/ML IJ SOLN
INTRAMUSCULAR | Status: DC | PRN
Start: 2014-03-23 — End: 2014-03-23
  Administered 2014-03-23: 30 mg via INTRAVENOUS

## 2014-03-23 MED ORDER — ROCURONIUM BROMIDE 50 MG/5ML IV SOLN
INTRAVENOUS | Status: AC
  Filled 2014-03-23: qty 1

## 2014-03-23 MED ORDER — LIDOCAINE HCL (CARDIAC) 20 MG/ML IV SOLN
INTRAVENOUS | Status: AC
Start: 2014-03-23 — End: 2014-03-23
  Filled 2014-03-23: qty 5

## 2014-03-23 MED ORDER — FENTANYL CITRATE 0.05 MG/ML IJ SOLN
INTRAMUSCULAR | Status: AC
  Filled 2014-03-23: qty 5

## 2014-03-23 MED ORDER — HYDROMORPHONE HCL PF 1 MG/ML IJ SOLN
INTRAMUSCULAR | Status: AC
  Filled 2014-03-23: qty 1

## 2014-03-23 MED ORDER — SODIUM CHLORIDE 0.9 % IJ SOLN
INTRAMUSCULAR | Status: AC
  Filled 2014-03-23: qty 10

## 2014-03-23 SURGICAL SUPPLY — 49 items
APL SKNCLS STERI-STRIP NONHPOA (GAUZE/BANDAGES/DRESSINGS) ×1
APPLIER CLIP 5 13 M/L LIGAMAX5 (MISCELLANEOUS) ×3
APR CLP MED LRG 5 ANG JAW (MISCELLANEOUS) ×1
BAG SPEC RTRVL LRG 6X4 10 (ENDOMECHANICALS) ×1
BANDAGE ADH SHEER 1  50/CT (GAUZE/BANDAGES/DRESSINGS) ×12 IMPLANT
BENZOIN TINCTURE PRP APPL 2/3 (GAUZE/BANDAGES/DRESSINGS) ×3 IMPLANT
CANISTER SUCTION 2500CC (MISCELLANEOUS) ×3 IMPLANT
CHLORAPREP W/TINT 26ML (MISCELLANEOUS) ×3 IMPLANT
CLIP APPLIE 5 13 M/L LIGAMAX5 (MISCELLANEOUS) ×1 IMPLANT
CLOSURE WOUND 1/2 X4 (GAUZE/BANDAGES/DRESSINGS) ×1
COVER MAYO STAND STRL (DRAPES) IMPLANT
COVER SURGICAL LIGHT HANDLE (MISCELLANEOUS) ×3 IMPLANT
DECANTER SPIKE VIAL GLASS SM (MISCELLANEOUS) ×3 IMPLANT
DRAPE C-ARM 42X72 X-RAY (DRAPES) IMPLANT
DRAPE UTILITY 15X26 W/TAPE STR (DRAPE) ×6 IMPLANT
ELECT REM PT RETURN 9FT ADLT (ELECTROSURGICAL) ×3
ELECTRODE REM PT RTRN 9FT ADLT (ELECTROSURGICAL) ×1 IMPLANT
GLOVE BIO SURGEON STRL SZ7.5 (GLOVE) ×2 IMPLANT
GLOVE BIOGEL PI IND STRL 6.5 (GLOVE) IMPLANT
GLOVE BIOGEL PI IND STRL 7.0 (GLOVE) IMPLANT
GLOVE BIOGEL PI IND STRL 7.5 (GLOVE) IMPLANT
GLOVE BIOGEL PI INDICATOR 6.5 (GLOVE) ×2
GLOVE BIOGEL PI INDICATOR 7.0 (GLOVE) ×2
GLOVE BIOGEL PI INDICATOR 7.5 (GLOVE) ×4
GLOVE SURG SIGNA 7.5 PF LTX (GLOVE) ×3 IMPLANT
GLOVE SURG SS PI 6.5 STRL IVOR (GLOVE) ×2 IMPLANT
GOWN STRL REUS W/ TWL LRG LVL3 (GOWN DISPOSABLE) ×3 IMPLANT
GOWN STRL REUS W/ TWL XL LVL3 (GOWN DISPOSABLE) ×1 IMPLANT
GOWN STRL REUS W/TWL LRG LVL3 (GOWN DISPOSABLE) ×9
GOWN STRL REUS W/TWL XL LVL3 (GOWN DISPOSABLE) ×3
KIT BASIN OR (CUSTOM PROCEDURE TRAY) ×3 IMPLANT
KIT ROOM TURNOVER OR (KITS) ×3 IMPLANT
NS IRRIG 1000ML POUR BTL (IV SOLUTION) ×3 IMPLANT
PAD ARMBOARD 7.5X6 YLW CONV (MISCELLANEOUS) ×3 IMPLANT
POUCH SPECIMEN RETRIEVAL 10MM (ENDOMECHANICALS) ×2 IMPLANT
SCISSORS LAP 5X35 DISP (ENDOMECHANICALS) ×3 IMPLANT
SET CHOLANGIOGRAPH 5 50 .035 (SET/KITS/TRAYS/PACK) IMPLANT
SET IRRIG TUBING LAPAROSCOPIC (IRRIGATION / IRRIGATOR) ×3 IMPLANT
SLEEVE ENDOPATH XCEL 5M (ENDOMECHANICALS) ×6 IMPLANT
SPECIMEN JAR SMALL (MISCELLANEOUS) ×3 IMPLANT
STRIP CLOSURE SKIN 1/2X4 (GAUZE/BANDAGES/DRESSINGS) ×1 IMPLANT
SUT MON AB 4-0 PC3 18 (SUTURE) ×3 IMPLANT
TOWEL OR 17X24 6PK STRL BLUE (TOWEL DISPOSABLE) ×3 IMPLANT
TOWEL OR 17X26 10 PK STRL BLUE (TOWEL DISPOSABLE) ×3 IMPLANT
TRAY LAPAROSCOPIC (CUSTOM PROCEDURE TRAY) ×3 IMPLANT
TROCAR XCEL BLUNT TIP 100MML (ENDOMECHANICALS) ×3 IMPLANT
TROCAR XCEL NON-BLD 5MMX100MML (ENDOMECHANICALS) ×3 IMPLANT
TUBING INSUFFLATION (TUBING) ×2 IMPLANT
WATER STERILE IRR 1000ML POUR (IV SOLUTION) IMPLANT

## 2014-03-23 NOTE — Interval H&P Note (Signed)
History and Physical Interval Note: no change in h and p  03/23/2014 8:28 AM  Jessica Duke  has presented today for surgery, with the diagnosis of gallstones  The various methods of treatment have been discussed with the patient and family. After consideration of risks, benefits and other options for treatment, the patient has consented to  Procedure(s): LAPAROSCOPIC CHOLECYSTECTOMY (N/A) as a surgical intervention .  The patient's history has been reviewed, patient examined, no change in status, stable for surgery.  I have reviewed the patient's chart and labs.  Questions were answered to the patient's satisfaction.     Bufford Helms A

## 2014-03-23 NOTE — Discharge Instructions (Signed)
[facility] ______[facility] LAPAROSCOPIC SURGERY: POST OP INSTRUCTIONS Always review your discharge instruction sheet given to you by the facility where your surgery was performed. IF YOU HAVE DISABILITY OR FAMILY LEAVE FORMS, YOU MUST BRING THEM TO THE OFFICE FOR PROCESSING.   DO NOT GIVE THEM TO YOUR DOCTOR.  1. A prescription for pain medication may be given to you upon discharge.  Take your pain medication as prescribed, if needed.  If narcotic pain medicine is not needed, then you may take acetaminophen (Tylenol) or ibuprofen (Advil) as needed. 2. Take your usually prescribed medications unless otherwise directed. 3. If you need a refill on your pain medication, please contact your pharmacy.  They will contact our office to request authorization. Prescriptions will not be filled after 5pm or on week-ends. 4. You should follow a light diet the first few days after arrival home, such as soup and crackers, etc.  Be sure to include lots of fluids daily. 5. Most patients will experience some swelling and bruising in the area of the incisions.  Ice packs will help.  Swelling and bruising can take several days to resolve.  6. It is common to experience some constipation if taking pain medication after surgery.  Increasing fluid intake and taking a stool softener (such as Colace) will usually help or prevent this problem from occurring.  A mild laxative (Milk of Magnesia or Miralax) should be taken according to package instructions if there are no bowel movements after 48 hours. 7. Unless discharge instructions indicate otherwise, you may remove your bandages 24-48 hours after surgery, and you may shower at that time.  You may have steri-strips (small skin tapes) in place directly over the incision.  These strips should be left on the skin for 7-10 days.  If your surgeon used skin glue on the incision, you may shower in 24 hours.  The glue will flake off over the next 2-3 weeks.  Any sutures or  staples will be removed at the office during your follow-up visit. 8. ACTIVITIES:  You may resume regular (light) daily activities beginning the next day--such as daily self-care, walking, climbing stairs--gradually increasing activities as tolerated.  You may have sexual intercourse when it is comfortable.  Refrain from any heavy lifting or straining until approved by your doctor. a. You may drive when you are no longer taking prescription pain medication, you can comfortably wear a seatbelt, and you can safely maneuver your car and apply brakes. b. RETURN TO WORK:  __________________________________________________________ 9. You should see your doctor in the office for a follow-up appointment approximately 2-3 weeks after your surgery.  Make sure that you call for this appointment within a day or two after you arrive home to insure a convenient appointment time. OTHER INSTRUCTIONS: ______NO LIFTING MORE THAN 15 POUNDS FOR 2 WEEKS. ICE PACK AND IBUPROFEN ALSO FOR PAIN____________________________________________________________________________________________________________________ __________________________________________________________________________________________________________________________ WHEN TO CALL YOUR DOCTOR: 1. Fever over 101.0 2. Inability to urinate 3. Continued bleeding from incision. 4. Increased pain, redness, or drainage from the incision. 5. Increasing abdominal pain  The clinic staff is available to answer your questions during regular business hours.  Please dont hesitate to call and ask to speak to one of the nurses for clinical concerns.  If you have a medical emergency, go to the nearest emergency room or call 911.  A surgeon from [facility] is always on call at the hospital. [address], [address], [place], [place]  [address] ? [id number], [place],  [place]   [id number] [phone] ? [phone] ? FAX [phone] Web site:  [email or web address]  What to eat:  For your first meals, you should eat lightly; only small meals initially.  If you do not have nausea, you may eat larger meals.  Avoid spicy, greasy and heavy food.    General Anesthesia, Adult, Care After  Refer to this sheet in the next few weeks. These instructions provide you with information on caring for yourself after your procedure. Your health care provider may also give you more specific instructions. Your treatment has been planned according to current medical practices, but problems sometimes occur. Call your health care provider if you have any problems or questions after your procedure.  WHAT TO EXPECT AFTER THE PROCEDURE  After the procedure, it is typical to experience:  Sleepiness.  Nausea and vomiting. HOME CARE INSTRUCTIONS  For the first 24 hours after general anesthesia:  Have a responsible person with you.  Do not drive a car. If you are alone, do not take public transportation.  Do not drink alcohol.  Do not take medicine that has not been prescribed by your health care provider.  Do not sign important papers or make important decisions.  You may resume a normal diet and activities as directed by your health care provider.  Change bandages (dressings) as directed.  If you have questions or problems that seem related to general anesthesia, call the hospital and ask for the anesthetist or anesthesiologist on call. SEEK MEDICAL CARE IF:  You have nausea and vomiting that continue the day after anesthesia.  You develop a rash. SEEK IMMEDIATE MEDICAL CARE IF:  You have difficulty breathing.  You have chest pain.  You have any allergic problems. Document Released: 11/17/2000 Document Revised: 04/13/2013 Document Reviewed: 02/24/2013  [facility] Patient Information 2014 [employer] [place].

## 2014-03-23 NOTE — Anesthesia Procedure Notes (Signed)
Procedure Name: Intubation Date/Time: 03/23/2014 9:43 AM Performed by: De NurseENNIE, Nancee Brownrigg E Pre-anesthesia Checklist: Patient identified, Emergency Drugs available, Suction available, Patient being monitored and Timeout performed Patient Re-evaluated:Patient Re-evaluated prior to inductionOxygen Delivery Method: Circle system utilized Preoxygenation: Pre-oxygenation with 100% oxygen Intubation Type: IV induction Ventilation: Mask ventilation without difficulty Laryngoscope Size: Mac and 3 Grade View: Grade I Tube type: Oral Tube size: 7.0 mm Number of attempts: 1 Airway Equipment and Method: Stylet Placement Confirmation: ETT inserted through vocal cords under direct vision,  positive ETCO2 and breath sounds checked- equal and bilateral Secured at: 21 cm Tube secured with: Tape Dental Injury: Teeth and Oropharynx as per pre-operative assessment

## 2014-03-23 NOTE — Transfer of Care (Signed)
Immediate Anesthesia Transfer of Care Note  Patient: Jessica Duke  Procedure(s) Performed: Procedure(s): LAPAROSCOPIC CHOLECYSTECTOMY (N/A)  Patient Location: PACU  Anesthesia Type:General  Level of Consciousness: lethargic and responds to stimulation  Airway & Oxygen Therapy: Patient Spontanous Breathing and Patient connected to nasal cannula oxygen  Post-op Assessment: Report given to PACU RN  Post vital signs: Reviewed and stable  Complications: No apparent anesthesia complications

## 2014-03-23 NOTE — Anesthesia Preprocedure Evaluation (Addendum)
Anesthesia Evaluation  Patient identified by MRN, date of birth, ID band Patient awake    Reviewed: Allergy & Precautions, H&P , NPO status , Patient's Chart, lab work & pertinent test results  Airway Mallampati: I TM Distance: >3 FB Neck ROM: Full    Dental  (+) Teeth Intact, Dental Advisory Given   Pulmonary  breath sounds clear to auscultation        Cardiovascular hypertension, Pt. on medications Rhythm:Regular Rate:Normal     Neuro/Psych    GI/Hepatic GERD-  Medicated and Controlled,  Endo/Other  Hypothyroidism   Renal/GU      Musculoskeletal   Abdominal   Peds  Hematology   Anesthesia Other Findings   Reproductive/Obstetrics                         Anesthesia Physical Anesthesia Plan  ASA: II  Anesthesia Plan: General   Post-op Pain Management:    Induction: Intravenous  Airway Management Planned: Oral ETT  Additional Equipment:   Intra-op Plan:   Post-operative Plan: Extubation in OR  Informed Consent: I have reviewed the patients History and Physical, chart, labs and discussed the procedure including the risks, benefits and alternatives for the proposed anesthesia with the patient or authorized representative who has indicated his/her understanding and acceptance.   Dental advisory given  Plan Discussed with: CRNA, Anesthesiologist and Surgeon  Anesthesia Plan Comments:         Anesthesia Quick Evaluation

## 2014-03-23 NOTE — Anesthesia Postprocedure Evaluation (Signed)
  Anesthesia Post-op Note  Patient: Jessica Duke  Procedure(s) Performed: Procedure(s): LAPAROSCOPIC CHOLECYSTECTOMY (N/A)  Patient Location: PACU  Anesthesia Type:General  Level of Consciousness: awake and alert   Airway and Oxygen Therapy: Patient Spontanous Breathing  Post-op Pain: mild  Post-op Assessment: Post-op Vital signs reviewed, Patient's Cardiovascular Status Stable and Respiratory Function Stable  Post-op Vital Signs: Reviewed  Filed Vitals:   03/23/14 1145  BP: 147/78  Pulse: 60  Temp:   Resp: 20    Complications: No apparent anesthesia complications

## 2014-03-23 NOTE — Op Note (Signed)
Laparoscopic Cholecystectomy Procedure Note  Indications: This patient presents with symptomatic gallbladder disease and will undergo laparoscopic cholecystectomy.  Pre-operative Diagnosis: Calculus of gallbladder without mention of cholecystitis or obstruction  Post-operative Diagnosis: Same  Surgeon: Abigail MiyamotoBLACKMAN,Norine Reddington A   Assistants: 0  Anesthesia: General endotracheal anesthesia  ASA Class: 2  Procedure Details  The patient was seen again in the Holding Room. The risks, benefits, complications, treatment options, and expected outcomes were discussed with the patient. The possibilities of reaction to medication, pulmonary aspiration, perforation of viscus, bleeding, recurrent infection, finding a normal gallbladder, the need for additional procedures, failure to diagnose a condition, the possible need to convert to an open procedure, and creating a complication requiring transfusion or operation were discussed with the patient. The likelihood of improving the patient's symptoms with return to their baseline status is good.  The patient and/or family concurred with the proposed plan, giving informed consent. The site of surgery properly noted. The patient was taken to Operating Room, identified as Jessica Duke and the procedure verified as Laparoscopic Cholecystectomy with Intraoperative Cholangiogram. A Time Out was held and the above information confirmed.  Prior to the induction of general anesthesia, antibiotic prophylaxis was administered. General endotracheal anesthesia was then administered and tolerated well. After the induction, the abdomen was prepped with Chloraprep and draped in sterile fashion. The patient was positioned in the supine position.  Local anesthetic agent was injected into the skin near the umbilicus and an incision made. We dissected down to the abdominal fascia with blunt dissection.  The fascia was incised vertically and we entered the peritoneal cavity bluntly.  A  pursestring suture of 0-Vicryl was placed around the fascial opening.  The Hasson cannula was inserted and secured with the stay suture.  Pneumoperitoneum was then created with CO2 and tolerated well without any adverse changes in the patient's vital signs. An 11-mm port was placed in the subxiphoid position.  Two 5-mm ports were placed in the right upper quadrant. All skin incisions were infiltrated with a local anesthetic agent before making the incision and placing the trocars.   We positioned the patient in reverse Trendelenburg, tilted slightly to the patient's left.  The gallbladder was identified, the fundus grasped and retracted cephalad. Adhesions were lysed bluntly and with the electrocautery where indicated, taking care not to injure any adjacent organs or viscus. The infundibulum was grasped and retracted laterally, exposing the peritoneum overlying the triangle of Calot. This was then divided and exposed in a blunt fashion. The cystic duct was clearly identified and bluntly dissected circumferentially. A critical view of the cystic duct and cystic artery was obtained.  The cystic duct was then ligated with clips and divided. The cystic artery was, dissected free, ligated with clips and divided as well.   The gallbladder was dissected from the liver bed in retrograde fashion with the electrocautery. The gallbladder was removed and placed in an Endocatch sac. The liver bed was irrigated and inspected. Hemostasis was achieved with the electrocautery. Copious irrigation was utilized and was repeatedly aspirated until clear.  The gallbladder and Endocatch sac were then removed through the umbilical port site.  The pursestring suture was used to close the umbilical fascia.    We again inspected the right upper quadrant for hemostasis.  Pneumoperitoneum was released as we removed the trocars.  4-0 Monocryl was used to close the skin.   Benzoin, steri-strips, and clean dressings were applied. The patient  was then extubated and brought to the recovery room in  stable condition. Instrument, sponge, and needle counts were correct at closure and at the conclusion of the case.   Findings: Chronic Cholecystitis with Cholelithiasis  Estimated Blood Loss: Minimal         Drains: 0         Specimens: Gallbladder           Complications: None; patient tolerated the procedure well.         Disposition: PACU - hemodynamically stable.         Condition: stable

## 2014-03-27 ENCOUNTER — Encounter (HOSPITAL_COMMUNITY): Payer: Self-pay | Admitting: Surgery

## 2014-04-10 ENCOUNTER — Ambulatory Visit (INDEPENDENT_AMBULATORY_CARE_PROVIDER_SITE_OTHER): Payer: 59 | Admitting: Surgery

## 2014-04-10 ENCOUNTER — Encounter (INDEPENDENT_AMBULATORY_CARE_PROVIDER_SITE_OTHER): Payer: Self-pay

## 2014-04-10 VITALS — BP 130/74 | HR 61 | Temp 97.9°F | Ht 67.0 in | Wt 149.0 lb

## 2014-04-10 DIAGNOSIS — Z09 Encounter for follow-up examination after completed treatment for conditions other than malignant neoplasm: Secondary | ICD-10-CM

## 2014-04-10 NOTE — Progress Notes (Signed)
Subjective:     Patient ID: Jessica Duke, female   DOB: 04/23/1956, 58 y.o.   MRN: [id number]  HPI  She is here for first postop visit status post laparoscopic cholecystectomy. She is doing well. She has no complaints. Review of Systems     Objective:   Physical Exam On exam, her incisions are healing well. The final pathology showed chronic cholecystitis with gallstones    Assessment:     Stable postop     Plan:     She may resume her normal activity. I will see her back as needed

## 2014-10-30 ENCOUNTER — Telehealth: Payer: Self-pay | Admitting: Family Medicine

## 2014-10-30 MED ORDER — LEVOTHYROXINE SODIUM 88 MCG PO TABS: 88.0000 ug | ORAL_TABLET | Freq: Every day | ORAL | Status: DC

## 2014-10-30 NOTE — Telephone Encounter (Signed)
Called patient and she wanted her thyroid medication (not BP) refilled.  Informed the patient needs to scheduel appt. With Dr. Abner GreenspanBlyth to continue getting refills.  Schedule appt. On 12/18/14 at 7:45am.

## 2014-10-30 NOTE — Telephone Encounter (Signed)
Caller name: Della Relation to pt: self Call back number:  [phone] Pharmacy: [facility]  Reason for call:   Requesting lisinopril refill

## 2014-12-15 ENCOUNTER — Other Ambulatory Visit: Payer: Self-pay | Admitting: Family Medicine

## 2014-12-18 ENCOUNTER — Encounter: Payer: Self-pay | Admitting: Family Medicine

## 2014-12-18 ENCOUNTER — Ambulatory Visit (INDEPENDENT_AMBULATORY_CARE_PROVIDER_SITE_OTHER): Payer: BLUE CROSS/BLUE SHIELD | Admitting: Family Medicine

## 2014-12-18 VITALS — BP 120/82 | HR 69 | Temp 97.8°F | Ht 67.0 in | Wt 151.4 lb

## 2014-12-18 DIAGNOSIS — E782 Mixed hyperlipidemia: Secondary | ICD-10-CM

## 2014-12-18 DIAGNOSIS — K219 Gastro-esophageal reflux disease without esophagitis: Secondary | ICD-10-CM

## 2014-12-18 DIAGNOSIS — R739 Hyperglycemia, unspecified: Secondary | ICD-10-CM

## 2014-12-18 DIAGNOSIS — E785 Hyperlipidemia, unspecified: Secondary | ICD-10-CM

## 2014-12-18 DIAGNOSIS — I10 Essential (primary) hypertension: Secondary | ICD-10-CM

## 2014-12-18 DIAGNOSIS — E079 Disorder of thyroid, unspecified: Secondary | ICD-10-CM

## 2014-12-18 LAB — COMPREHENSIVE METABOLIC PANEL
ALK PHOS: 46 U/L (ref 39–117)
ALT: 11 U/L (ref 0–35)
AST: 14 U/L (ref 0–37)
Albumin: 4.3 g/dL (ref 3.5–5.2)
BUN: 19 mg/dL (ref 6–23)
CHLORIDE: 107 meq/L (ref 96–112)
CO2: 26 meq/L (ref 19–32)
CREATININE: 0.82 mg/dL (ref 0.40–1.20)
Calcium: 9.4 mg/dL (ref 8.4–10.5)
GFR: 75.84 mL/min (ref >60.00)
Glucose, Bld: 100 mg/dL — ABNORMAL HIGH (ref 70–99)
Potassium: 3.7 mEq/L (ref 3.5–5.1)
SODIUM: 140 meq/L (ref 135–145)
TOTAL PROTEIN: 6.7 g/dL (ref 6.0–8.3)
Total Bilirubin: 0.7 mg/dL (ref 0.2–1.2)

## 2014-12-18 LAB — CBC
HCT: 40 % (ref 36.0–46.0)
Hemoglobin: 13.8 g/dL (ref 12.0–15.0)
MCHC: 34.5 g/dL (ref 30.0–36.0)
MCV: 88.1 fl (ref 78.0–100.0)
PLATELETS: 167 10*3/uL (ref 150.0–400.0)
RBC: 4.54 Mil/uL (ref 3.87–5.11)
RDW: 12.7 % (ref 11.5–15.5)
WBC: 4.2 10*3/uL (ref 4.0–10.5)

## 2014-12-18 LAB — LIPID PANEL
Cholesterol: 217 mg/dL — ABNORMAL HIGH (ref 0–200)
HDL: 65.7 mg/dL (ref >39.00)
LDL Cholesterol: 140 mg/dL — ABNORMAL HIGH (ref 0–99)
NonHDL: 151.3
TRIGLYCERIDES: 56 mg/dL (ref 0.0–149.0)
Total CHOL/HDL Ratio: 3
VLDL: 11.2 mg/dL (ref 0.0–40.0)

## 2014-12-18 LAB — TSH: TSH: 1.58 u[IU]/mL (ref 0.35–4.50)

## 2014-12-18 LAB — HEMOGLOBIN A1C: HEMOGLOBIN A1C: 5.7 % (ref 4.6–6.5)

## 2014-12-18 MED ORDER — LEVOTHYROXINE SODIUM 88 MCG PO TABS: ORAL_TABLET | ORAL | Status: DC

## 2014-12-18 NOTE — Progress Notes (Signed)
Pre visit review using our clinic review tool, if applicable. No additional management support is needed unless otherwise documented below in the visit note. 

## 2014-12-18 NOTE — Patient Instructions (Signed)
  tums as needed or Ranitidine/Zantac 150 mg up to twice a day as needed  Food Choices for Gastroesophageal Reflux Disease When you have gastroesophageal reflux disease (GERD), the foods you eat and your eating habits are very important. Choosing the right foods can help ease your discomfort.  WHAT GUIDELINES DO I NEED TO FOLLOW?   Choose fruits, vegetables, whole grains, and low-fat dairy products.   Choose low-fat meat, fish, and poultry.  Limit fats such as oils, salad dressings, butter, nuts, and avocado.   Keep a food diary. This helps you identify foods that cause symptoms.   Avoid foods that cause symptoms. These may be different for everyone.   Eat small meals often instead of 3 large meals a day.   Eat your meals slowly, in a place where you are relaxed.   Limit fried foods.   Cook foods using methods other than frying.   Avoid drinking alcohol.   Avoid drinking large amounts of liquids with your meals.   Avoid bending over or lying down until 2-3 hours after eating.  WHAT FOODS ARE NOT RECOMMENDED?  These are some foods and drinks that may make your symptoms worse: Vegetables Tomatoes. Tomato juice. Tomato and spaghetti sauce. Chili peppers. Onion and garlic. Horseradish. Fruits Oranges, grapefruit, and lemon (fruit and juice). Meats High-fat meats, fish, and poultry. This includes hot dogs, ribs, ham, sausage, salami, and bacon. Dairy Whole milk and chocolate milk. Sour cream. Cream. Butter. Ice cream. Cream cheese.  Drinks Coffee and tea. Bubbly (carbonated) drinks or energy drinks. Condiments Hot sauce. Barbecue sauce.  Sweets/Desserts Chocolate and cocoa. Donuts. Peppermint and spearmint. Fats and Oils High-fat foods. This includes [place] fries and potato chips. Other Vinegar. Strong spices. This includes black pepper, white pepper, red pepper, cayenne, curry powder, cloves, ginger, and chili powder. The items listed above may not be a  complete list of foods and drinks to avoid. Contact your dietitian for more information. Document Released: 02/10/2012 Document Revised: 08/16/2013 Document Reviewed: 06/15/2013 [facility] Patient Information 2015 [employer] [place]. This information is not intended to replace advice given to you by your health care provider. Make sure you discuss any questions you have with your health care provider.

## 2014-12-18 NOTE — Assessment & Plan Note (Signed)
Avoid offending foods, start probiotics. Do not eat large meals in late evening and consider raising head of bed.  

## 2014-12-18 NOTE — Assessment & Plan Note (Signed)
Well controlled, no changes to meds. Encouraged heart healthy diet such as the DASH diet and exercise as tolerated.  °

## 2014-12-18 NOTE — Progress Notes (Signed)
Jessica Duke  [id number] 01-30-1956 12/18/2014      Progress Note-Follow Up  Subjective  Chief Complaint  Chief Complaint  Patient presents with  . Follow-up    HPI  Patient is a 59 y.o. female in today for routine medical care. Patient is in today for follow-up. Generally feeling well. Continues to struggle with occasional dyspepsia but denies sore throat or significant abdominal pain. No recent illness or acute concerns. Is moving to [place] soon with his family. Denies CP/palp/SOB/HA/congestion/fevers/GI or GU c/o. Taking meds as prescribed  Past Medical History  Diagnosis Date  . Thyroid disease   . Preventative health care 09/21/2012  . Ganglion cyst 09/21/2012  . Hyperlipidemia 09/21/2012  . Elevated BP 09/21/2012  . H. pylori infection 10/18/2012  . Allergic state 10/18/2012  . Pedal edema 10/18/2012  . HTN (hypertension) 09/21/2012  . Gallstones   . Hypothyroidism   . Tuberculosis     pt. reports that she always test + for TB, but she has not needed any further workup    Past Surgical History  Procedure Laterality Date  . Cesarean section  1986  . Cholecystectomy N/A 03/23/2014    Procedure: LAPAROSCOPIC CHOLECYSTECTOMY;  Surgeon: Shelly Rubenstein, MD;  Location: [facility] OR;  Service: General;  Laterality: N/A;    Family History  Problem Relation Age of Onset  . Hypertension Mother   . Other Mother     kidney problem  . Hypertension Father   . Hyperlipidemia Father   . Hypertension Brother     History   Social History  . Marital Status: Married    Spouse Name: N/A  . Number of Children: N/A  . Years of Education: N/A   Occupational History  . Not on file.   Social History Main Topics  . Smoking status: Never Smoker   . Smokeless tobacco: Never Used  . Alcohol Use: Yes     Comment: occasionally  . Drug Use: No  . Sexual Activity: Not on file   Other Topics Concern  . Not on file   Social History Narrative    Current Outpatient Prescriptions on  File Prior to Visit  Medication Sig Dispense Refill  . calcium carbonate (OS-CAL - DOSED IN MG OF ELEMENTAL CALCIUM) 1250 MG tablet Take 1 tablet by mouth daily with breakfast.    . Multiple Vitamin (MULTIVITAMIN) tablet Take 1 tablet by mouth daily.     No current facility-administered medications on file prior to visit.    No Known Allergies  Review of Systems  Review of Systems  Constitutional: Negative for fever and malaise/fatigue.  HENT: Negative for congestion.   Eyes: Negative for discharge.  Respiratory: Negative for shortness of breath.   Cardiovascular: Negative for chest pain, palpitations and leg swelling.  Gastrointestinal: Positive for heartburn. Negative for nausea, abdominal pain and diarrhea.  Genitourinary: Negative for dysuria.  Musculoskeletal: Negative for falls.  Skin: Negative for rash.  Neurological: Negative for loss of consciousness and headaches.  Endo/Heme/Allergies: Negative for polydipsia.  Psychiatric/Behavioral: Negative for depression and suicidal ideas. The patient is not nervous/anxious and does not have insomnia.     Objective  BP 120/82 mmHg  Pulse 69  Temp(Src) 97.8 F (36.6 C) (Oral)  Ht  (1.702 m)  Wt 151 lb 6 oz (68.663 kg)  BMI 23.70 kg/m2  SpO2 98%  Physical Exam  Physical Exam  Constitutional: She is oriented to person, place, and time and well-developed, well-nourished, and in no distress. No distress.  HENT:  Head: Normocephalic and atraumatic.  Eyes: Conjunctivae are normal.  Neck: Neck supple. No thyromegaly present.  Cardiovascular: Normal rate, regular rhythm and normal heart sounds.   No murmur heard. Pulmonary/Chest: Effort normal and breath sounds normal. She has no wheezes.  Abdominal: She exhibits no distension and no mass.  Musculoskeletal: She exhibits no edema.  Lymphadenopathy:    She has no cervical adenopathy.  Neurological: She is alert and oriented to person, place, and time.  Skin: Skin is warm  and dry. No rash noted. She is not diaphoretic.  Psychiatric: Memory, affect and judgment normal.    Lab Results  Component Value Date   TSH 1.021 11/07/2013   Lab Results  Component Value Date   WBC 4.4 03/16/2014   HGB 13.2 03/16/2014   HCT 38.9 03/16/2014   MCV 89.0 03/16/2014   PLT 169 03/16/2014   Lab Results  Component Value Date   CREATININE 0.77 03/16/2014   BUN 15 03/16/2014   NA 142 03/16/2014   K 4.1 03/16/2014   CL 105 03/16/2014   CO2 26 03/16/2014   Lab Results  Component Value Date   ALT 11 11/07/2013   AST 13 11/07/2013   ALKPHOS 42 11/07/2013   BILITOT 0.9 11/07/2013   Lab Results  Component Value Date   CHOL 217* 11/07/2013   Lab Results  Component Value Date   HDL 61 11/07/2013   Lab Results  Component Value Date   LDLCALC 142* 11/07/2013   Lab Results  Component Value Date   TRIG 72 11/07/2013   Lab Results  Component Value Date   CHOLHDL 3.6 11/07/2013     Assessment & Plan  HTN (hypertension) Well controlled, no changes to meds. Encouraged heart healthy diet such as the DASH diet and exercise as tolerated.    GERD (gastroesophageal reflux disease) Avoid offending foods, start probiotics. Do not eat large meals in late evening and consider raising head of bed.    Hyperlipidemia Encouraged heart healthy diet, increase exercise, avoid trans fats, consider a krill oil cap daily   Hyperglycemia hgba1c acceptable, minimize simple carbs. Increase exercise as tolerated.

## 2014-12-18 NOTE — Assessment & Plan Note (Signed)
hgba1c acceptable, minimize simple carbs. Increase exercise as tolerated.  

## 2014-12-18 NOTE — Assessment & Plan Note (Signed)
Encouraged heart healthy diet, increase exercise, avoid trans fats, consider a krill oil cap daily 

## 2014-12-24 NOTE — Assessment & Plan Note (Signed)
On Levothyroxine, continue to monitor 

## 2015-05-18 IMAGING — US US ABDOMEN COMPLETE
1 series · 13 of 25 positions shown · non-contrast
Comparison: None

CLINICAL DATA: Chronic epigastric pain with nausea. Increasing over
the several months

EXAM:
ULTRASOUND ABDOMEN COMPLETE

[Series 1: us abdomen complete · 0.28mm/px · 13 of 69 slices shown]
[im 1/69]
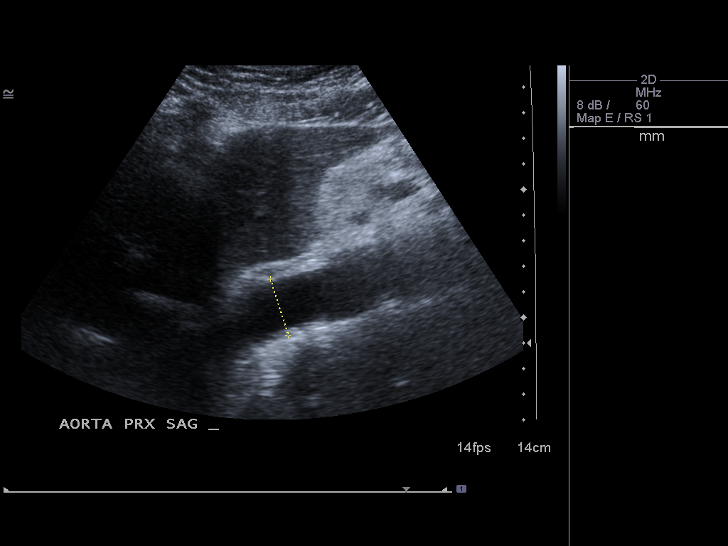
[im 6/69]
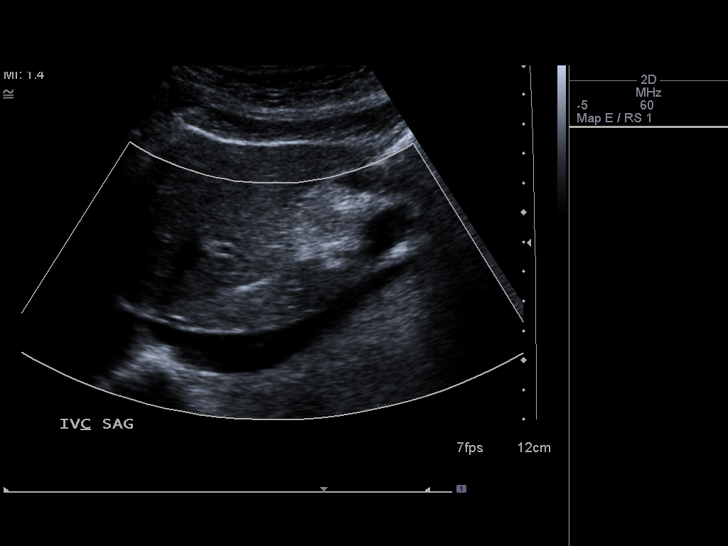
[im 12/69]
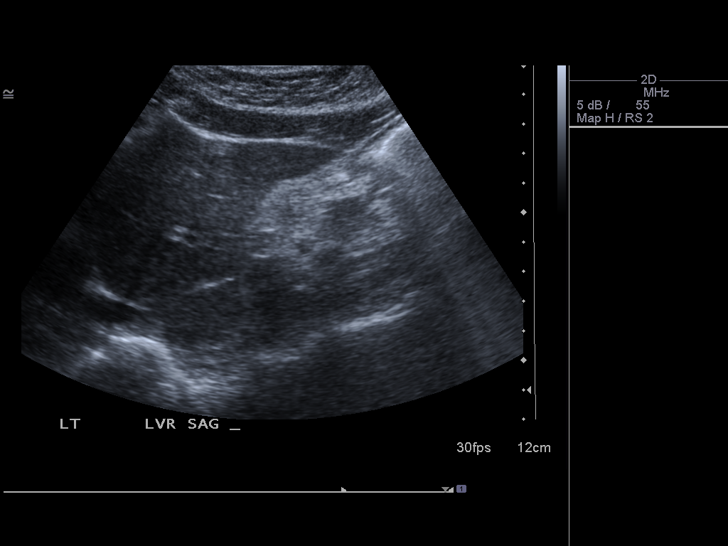
[im 18/69]
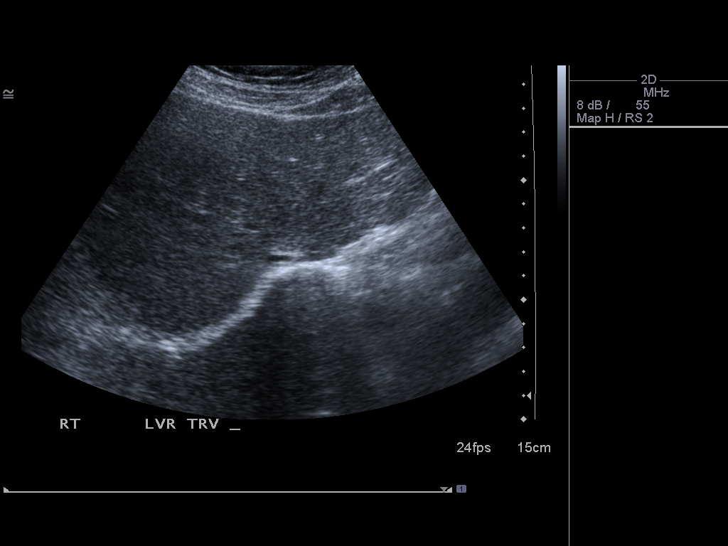
[im 23/69]
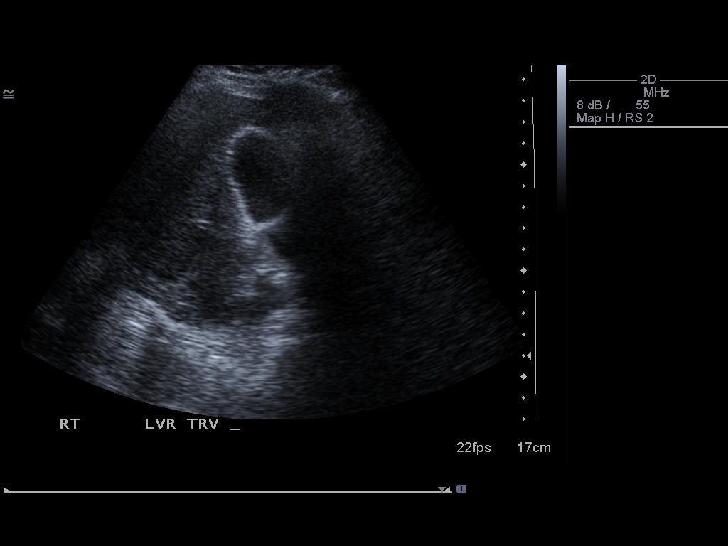
[im 29/69]
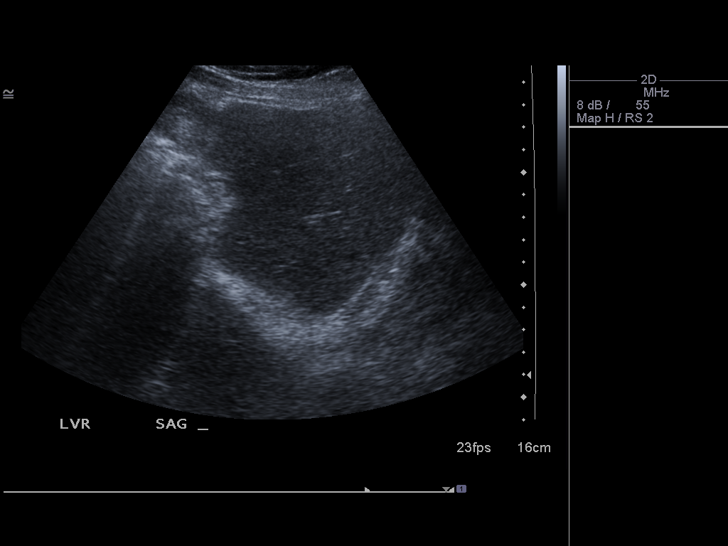
[im 35/69]
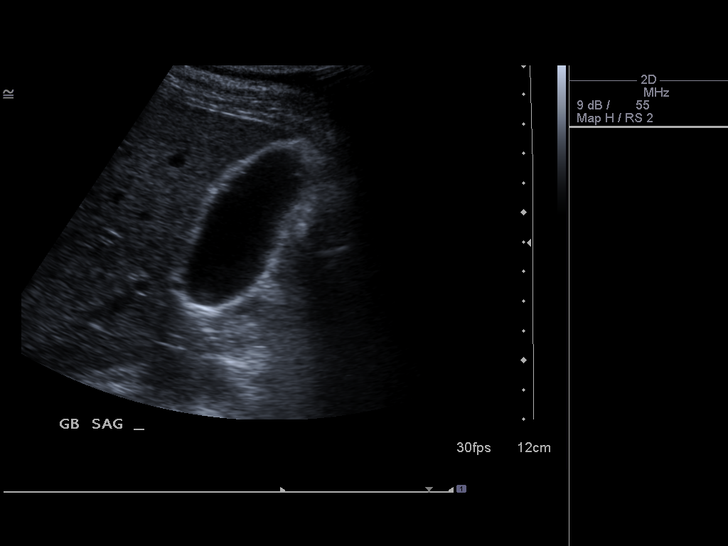
[im 40/69]
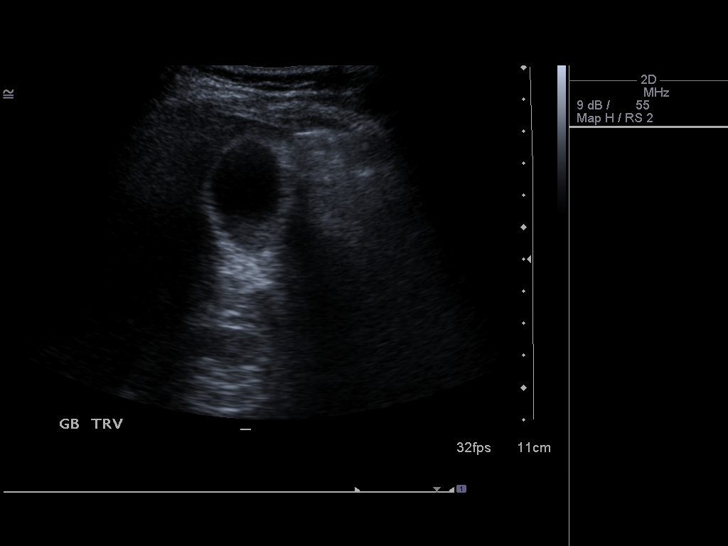
[im 46/69]
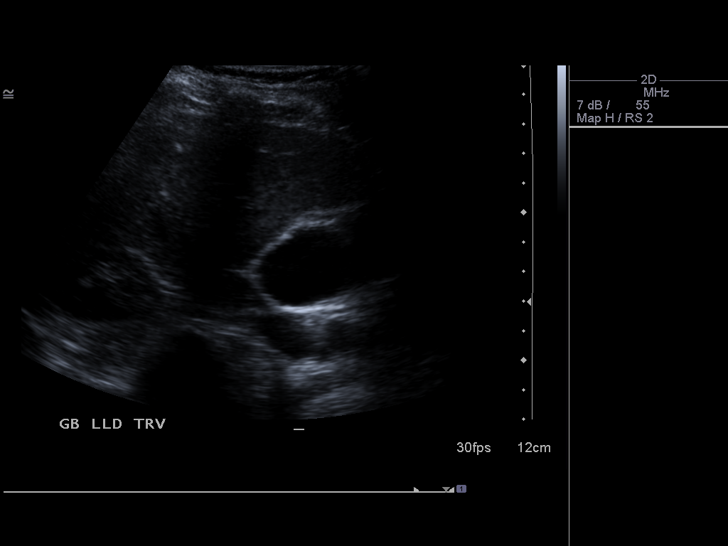
[im 52/69]
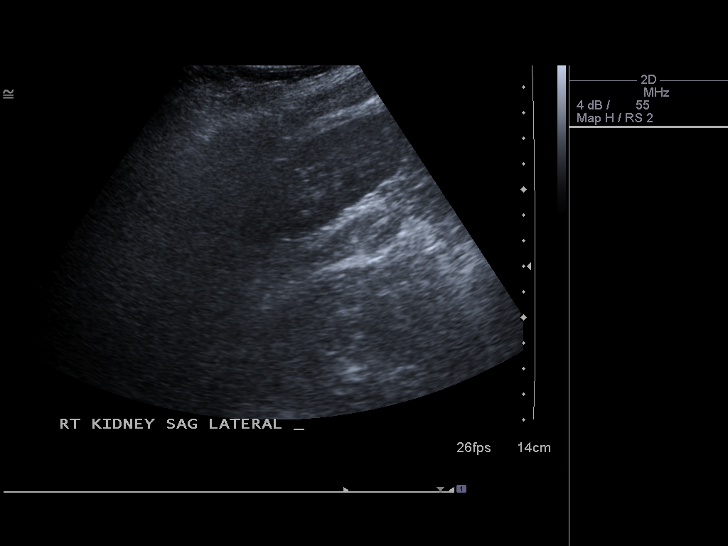
[im 57/69]
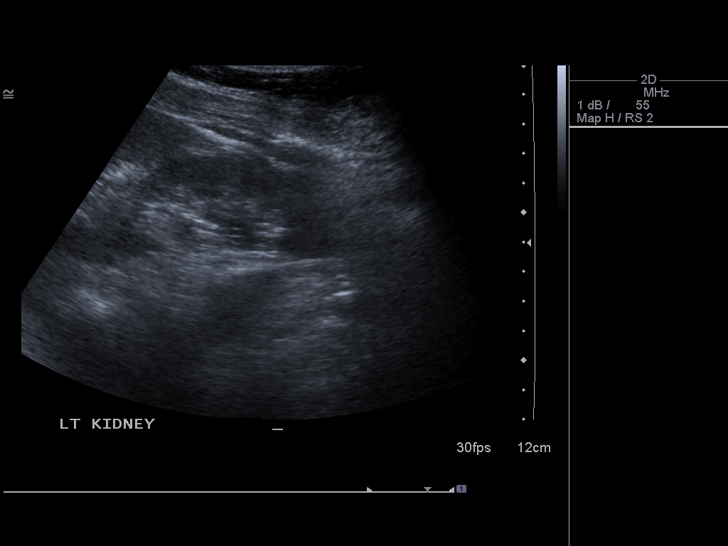
[im 63/69]
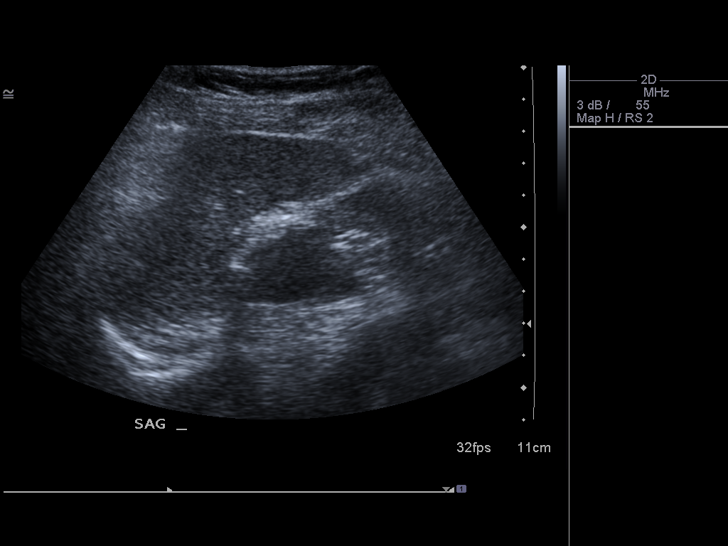
[im 69/69]
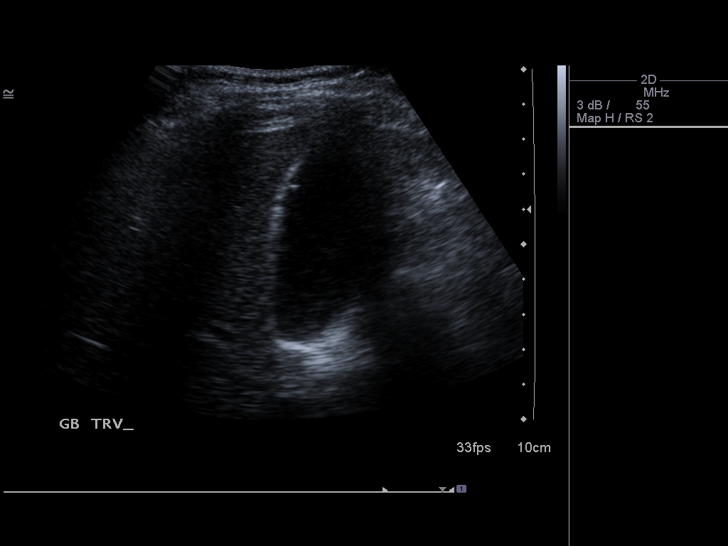

[13 of 25 positions shown; findings below may reference images not displayed]

FINDINGS: Gallbladder:

The gallbladder is adequately distended there are tiny echogenic
foci demonstrated which likely reflect stones there is focal
thickening of the gallbladder wall near the fundus to as much as 5
mm but elsewhere the thickness is less. There is no positive
sonographic Murphy's sign.

Common bile duct:

Diameter: 2 mm

Liver:

No focal lesion identified. Within normal limits in parenchymal
echogenicity.

IVC:

No abnormality visualized.

Pancreas:

Visualized portion unremarkable.

Spleen:

Size and appearance within normal limits.

Right Kidney:

Length: 10.6 cm. Echogenicity within normal limits. No mass or
hydronephrosis visualized.

Left Kidney:

Length: 10.6 cm. Echogenicity within normal limits. No mass or
hydronephrosis visualized.

Abdominal aorta:

No aneurysm visualized.

Other findings:

None.
IMPRESSION: 1. There are tiny echogenic gallstones which appear mobile. There is
focal gallbladder wall thickening. There is no pericholecystic or
positive sonographic Murphy's sign. The findings could reflect
cholelithiasis with chronic cholecystitis in the appropriate
clinical setting. Further evaluation with a nuclear medicine
hepatobiliary scan may be useful.
2. No acute abnormality elsewhere within the abdomen is
demonstrated.

## 2015-07-24 ENCOUNTER — Other Ambulatory Visit: Payer: Self-pay | Admitting: Family Medicine

## 2015-07-24 ENCOUNTER — Telehealth: Payer: Self-pay | Admitting: Family Medicine

## 2015-07-24 MED ORDER — LEVOTHYROXINE SODIUM 88 MCG PO TABS: ORAL_TABLET | ORAL | Status: DC

## 2015-07-26 NOTE — Telephone Encounter (Signed)
Refill done.  

## 2015-11-26 ENCOUNTER — Ambulatory Visit: Payer: Self-pay | Admitting: Family Medicine

## 2016-03-07 ENCOUNTER — Other Ambulatory Visit: Payer: Self-pay | Admitting: Family Medicine

## 2016-06-07 ENCOUNTER — Other Ambulatory Visit: Payer: Self-pay | Admitting: Family Medicine

## 2016-06-09 ENCOUNTER — Other Ambulatory Visit: Payer: Self-pay | Admitting: Family Medicine

## 2016-06-09 ENCOUNTER — Telehealth: Payer: Self-pay | Admitting: Family Medicine

## 2016-06-09 NOTE — Telephone Encounter (Signed)
Spouse. Refill request for levothyroxine.     [facility] [id number] [facility] [place], [place] [address]

## 2016-06-09 NOTE — Telephone Encounter (Signed)
Called the patient/husband to inform of denial/reason, no answer/no voice mail set up.

## 2016-06-09 NOTE — Telephone Encounter (Signed)
Refill denied already this am due to patient not being seen since 12/18/2014, last labs done on 12/18/2014.  Also no upcoming appt. Is scheduled.

## 2016-06-09 NOTE — Telephone Encounter (Signed)
Received another refill request/denied due to needing an OV/labs. Patient has not been seen since April 2016. Unable to fill due to refill office policy.
